# Patient Record
Sex: Male | Born: 1935
Health system: Southern US, Community
[De-identification: ages and names within clinical notes are randomized; demographics above are authoritative.]

## PROBLEM LIST (undated history)

## (undated) DIAGNOSIS — C801 Malignant (primary) neoplasm, unspecified: Secondary | ICD-10-CM

## (undated) DIAGNOSIS — I10 Essential (primary) hypertension: Secondary | ICD-10-CM

---

## 2004-07-10 ENCOUNTER — Ambulatory Visit: Payer: Self-pay | Admitting: Unknown Physician Specialty

## 2011-06-10 ENCOUNTER — Ambulatory Visit: Payer: Self-pay | Admitting: Ophthalmology

## 2011-06-10 LAB — POTASSIUM: Potassium: 4 mmol/L (ref 3.5–5.1)

## 2011-06-22 ENCOUNTER — Ambulatory Visit: Payer: Self-pay | Admitting: Ophthalmology

## 2012-01-20 ENCOUNTER — Ambulatory Visit: Payer: Self-pay | Admitting: Internal Medicine

## 2012-02-01 ENCOUNTER — Ambulatory Visit: Payer: Self-pay | Admitting: Ophthalmology

## 2012-02-08 ENCOUNTER — Ambulatory Visit: Payer: Self-pay | Admitting: Ophthalmology

## 2013-07-10 ENCOUNTER — Emergency Department: Payer: Self-pay | Admitting: Emergency Medicine

## 2013-07-10 DIAGNOSIS — N400 Enlarged prostate without lower urinary tract symptoms: Secondary | ICD-10-CM | POA: Insufficient documentation

## 2013-07-10 LAB — URINALYSIS, COMPLETE
Specific Gravity: 1.02 (ref 1.003–1.030)
Squamous Epithelial: NONE SEEN

## 2013-07-10 LAB — BASIC METABOLIC PANEL
ANION GAP: 6 — AB (ref 7–16)
BUN: 21 mg/dL — ABNORMAL HIGH (ref 7–18)
CREATININE: 1.14 mg/dL (ref 0.60–1.30)
Calcium, Total: 9.6 mg/dL (ref 8.5–10.1)
Chloride: 100 mmol/L (ref 98–107)
Co2: 30 mmol/L (ref 21–32)
EGFR (African American): >60
EGFR (Non-African Amer.): 57 — ABNORMAL LOW
Glucose: 131 mg/dL — ABNORMAL HIGH (ref 65–99)
OSMOLALITY: 277 (ref 275–301)
POTASSIUM: 3.5 mmol/L (ref 3.5–5.1)
SODIUM: 136 mmol/L (ref 136–145)

## 2013-07-10 LAB — CBC
HCT: 45.7 % (ref 40.0–52.0)
HGB: 15.1 g/dL (ref 13.0–18.0)
MCH: 30.9 pg (ref 26.0–34.0)
MCHC: 33 g/dL (ref 32.0–36.0)
MCV: 94 fL (ref 80–100)
PLATELETS: 168 10*3/uL (ref 150–440)
RBC: 4.88 10*6/uL (ref 4.40–5.90)
RDW: 13.5 % (ref 11.5–14.5)
WBC: 12.1 10*3/uL — AB (ref 3.8–10.6)

## 2013-07-11 ENCOUNTER — Inpatient Hospital Stay: Payer: Self-pay | Admitting: Urology

## 2013-07-12 DIAGNOSIS — Z01810 Encounter for preprocedural cardiovascular examination: Secondary | ICD-10-CM

## 2013-07-12 LAB — CBC WITH DIFFERENTIAL/PLATELET
BASOS ABS: 0 10*3/uL (ref 0.0–0.1)
BASOS PCT: 0.2 %
EOS ABS: 0.2 10*3/uL (ref 0.0–0.7)
EOS PCT: 1.6 %
HCT: 37 % — ABNORMAL LOW (ref 40.0–52.0)
HGB: 12.5 g/dL — AB (ref 13.0–18.0)
LYMPHS ABS: 1.1 10*3/uL (ref 1.0–3.6)
Lymphocyte %: 10.6 %
MCH: 31.8 pg (ref 26.0–34.0)
MCHC: 33.7 g/dL (ref 32.0–36.0)
MCV: 94 fL (ref 80–100)
MONOS PCT: 9.9 %
Monocyte #: 1 x10 3/mm (ref 0.2–1.0)
NEUTROS ABS: 7.9 10*3/uL — AB (ref 1.4–6.5)
Neutrophil %: 77.7 %
PLATELETS: 151 10*3/uL (ref 150–440)
RBC: 3.92 10*6/uL — AB (ref 4.40–5.90)
RDW: 13.1 % (ref 11.5–14.5)
WBC: 10.2 10*3/uL (ref 3.8–10.6)

## 2013-07-12 LAB — BASIC METABOLIC PANEL
Anion Gap: 4 — ABNORMAL LOW (ref 7–16)
BUN: 19 mg/dL — ABNORMAL HIGH (ref 7–18)
CALCIUM: 8.5 mg/dL (ref 8.5–10.1)
CO2: 30 mmol/L (ref 21–32)
CREATININE: 1.01 mg/dL (ref 0.60–1.30)
Chloride: 104 mmol/L (ref 98–107)
EGFR (Non-African Amer.): >60
Glucose: 120 mg/dL — ABNORMAL HIGH (ref 65–99)
OSMOLALITY: 279 (ref 275–301)
Potassium: 3.5 mmol/L (ref 3.5–5.1)
Sodium: 138 mmol/L (ref 136–145)

## 2013-07-13 LAB — BASIC METABOLIC PANEL
Anion Gap: 3 — ABNORMAL LOW (ref 7–16)
BUN: 20 mg/dL — AB (ref 7–18)
CHLORIDE: 101 mmol/L (ref 98–107)
CO2: 32 mmol/L (ref 21–32)
CREATININE: 0.92 mg/dL (ref 0.60–1.30)
Calcium, Total: 8.2 mg/dL — ABNORMAL LOW (ref 8.5–10.1)
EGFR (African American): >60
Glucose: 112 mg/dL — ABNORMAL HIGH (ref 65–99)
OSMOLALITY: 275 (ref 275–301)
Potassium: 4.1 mmol/L (ref 3.5–5.1)
SODIUM: 136 mmol/L (ref 136–145)

## 2013-07-13 LAB — HEMOGLOBIN: HGB: 11.3 g/dL — AB (ref 13.0–18.0)

## 2013-08-11 ENCOUNTER — Emergency Department: Payer: Self-pay | Admitting: Emergency Medicine

## 2013-08-11 LAB — URINALYSIS, COMPLETE
Bilirubin,UR: NEGATIVE
Glucose,UR: NEGATIVE mg/dL (ref 0–75)
Hyaline Cast: 2
Ketone: NEGATIVE
Nitrite: NEGATIVE
Ph: 7 (ref 4.5–8.0)
Protein: 30
RBC,UR: 245 /HPF (ref 0–5)
SQUAMOUS EPITHELIAL: NONE SEEN
Specific Gravity: 1.015 (ref 1.003–1.030)

## 2013-08-12 ENCOUNTER — Emergency Department: Payer: Self-pay | Admitting: Emergency Medicine

## 2013-08-14 LAB — URINE CULTURE

## 2014-07-02 NOTE — Op Note (Signed)
PATIENT NAME:  Robert [name], Robert [name] MR#:  [id number] DATE OF BIRTH:  [birth year]  DATE OF PROCEDURE:  02/08/2012  PREOPERATIVE DIAGNOSIS: Visually significant cataract of the right eye.   POSTOPERATIVE DIAGNOSIS: Visually significant cataract of the right eye.   OPERATIVE PROCEDURE: Cataract extraction by phacoemulsification with implant of intraocular lens to right eye.   SURGEON: Birder Robson, MD.   ANESTHESIA:  1. Managed anesthesia care.  2. Topical tetracaine drops followed by 2% Xylocaine jelly applied in the preoperative holding area.   COMPLICATIONS: None.   TECHNIQUE:  Stop and chop.  DESCRIPTION OF PROCEDURE: The patient was examined and consented in the preoperative holding area where the aforementioned topical anesthesia was applied to the right eye and then brought back to the Operating Room where the right eye was prepped and draped in the usual sterile ophthalmic fashion and a lid speculum was placed. A paracentesis was created with the side port blade and the anterior chamber was filled with viscoelastic. A near clear corneal incision was performed with the steel keratome. A continuous curvilinear capsulorrhexis was performed with a cystotome followed by the capsulorrhexis forceps. Hydrodissection and hydrodelineation were carried out with BSS on a blunt cannula. The lens was removed in a stop and chop technique and the remaining cortical material was removed with the irrigation-aspiration handpiece. The capsular bag was inflated with viscoelastic and the Tecnis ZCB00 14.5-diopter lens, serial number [id number] was placed in the capsular bag without complication. The remaining viscoelastic was removed from the eye with the irrigation-aspiration handpiece. The wounds were hydrated. The anterior chamber was flushed with Miostat and the eye was inflated to physiologic pressure. The wounds were found to be water tight. The eye was dressed with Vigamox. The patient was given protective  glasses to wear throughout the day and a shield with which to sleep tonight. The patient was also given drops with which to begin a drop regimen today and will follow-up with me in one day.   ____________________________ Livingston Diones. Hurbert Duran, MD wlp:slb D: 02/08/2012 16:34:18 ET T: 02/08/2012 16:52:20 ET JOB#: [id number]  cc: Dierre Crevier L. Tapanga Ottaway, MD, <Dictator> Livingston Diones Chessie Neuharth MD ELECTRONICALLY SIGNED 02/17/2012 15:44

## 2014-07-06 NOTE — Discharge Summary (Signed)
PATIENT NAME:  Robert [name], Robert [name] MR#:  [id number] DATE OF BIRTH:  Feb 09, 1923  DATE OF ADMISSION:  07/11/2013 DATE OF DISCHARGE:  07/13/2013   PREOPERATIVE DIAGNOSIS: Massive clot retention with benign prostatic hypertrophy and prostatic hemorrhage.   FINAL DIAGNOSIS: Massive clot retention with benign prostatic hypertrophy and prostatic hemorrhage.   PROCEDURE: On 07/12/2013, I did a clot evacuation and fulguration of a hemorrhagic prostate, especially in the middle lobe.   HOSPITAL COURSE: The patient's hospital course was basically benign. He was put in on Wednesday night, and on Thursday we did the procedure at 10:00 in the morning. The patient through the night had clot evacuation utilizing 3-way Foley, which was a 24-French 3-way with normal saline irrigation. At the time of surgery, I also removed quite a bit of clot in the bladder. Once the clot was removed, no tumors were seen. Quite a bit of saccule formation was seen, indicating long-term outlet obstruction. He had a large prostate and right now he has only been lateral lobe hypertrophy, as I necrosed the bleeding middle lobe that was in the way of the outlet. He is discharged on his normal home medications. During his hospitalization, he was given ceftriaxone. He will be discharged on Cipro 500 mg daily which he has at home, pain med in the form of Percocet. He has clear urine, so he is going to be discharged with a Foley catheter in place.   PHYSICAL EXAMINATION FOR DISCHARGE: Abdomen soft. Bowel sounds present. Alert and oriented x 3. No chest pain. No shortness of breath. No evidence of pain in his calf. He does have some foot pain, which is unknown. I examined it. There is no fracture, ecchymosis, or any evidence of anything more than a tendinitis in his lateral portion of his right foot. He moves the foot well and can stand and walk on it, so I do not think there is a fracture there at all, and there is no really reason for any problem.    His hemoglobin went from 15 to 11.3 as a result of this bleeding. He was not transfused.   I am going to see this patient in followup on Monday morning. He will be given a leg bag for his Foley and will be taken care of in the office. Future plans are for a microwave of his long lateral lobes, which are probably causing a degree of outlet obstruction.   ____________________________ Janice Coffin. Elnoria Howard, DO rdh:jcm D: 07/13/2013 13:34:06 ET T: 07/13/2013 14:19:29 ET JOB#: [id number]  cc: Janice Coffin. Elnoria Howard, DO, <Dictator> Schyler Counsell D Jakarius Flamenco DO ELECTRONICALLY SIGNED 07/27/2013 14:06

## 2014-07-06 NOTE — H&P (Signed)
PATIENT NAME:  Robert [name], Robert [name] MR#:  [id number] DATE OF BIRTH:  05/25/1922  DATE OF ADMISSION:  07/11/2013  Patient of Dr. Emily Filbert.  The patient is admitted after being seen in my office for gross hematuria and acute urinary retention. Irrigated his bladder with a 20-French coude catheter and emptied out quite a bit of dark urine and clots. Review of CT scan and reviewed clot in the bladder and a large bladder tumor. Prostatic enlargement was not significant. The patient has had 2 bleeds, one in January and one recently. This tumor, however, has been present for a long period of time. Smoking history is when he was young, so risk factors for smoking history at a young age. Coincidentally, a calculus was seen in the bladder and this is probably a calculus that he passed recently down the right ureter, as there is some hydronephrosis on the right side.  Otherwise, his medical history is benign.  He has hypertension, for which he takes larsatan Otherwise, he has no other major medications at home, other than vitamins.  PAST SURGICAL HISTORY:  Relatively benign. He has had bilateral hernias. He has never had heart surgery, stroke. Never been anticoagulated. He does use CPAP. This he uses at home. He will have an order written to use it here.  REVIEW OF SYSTEMS:  Reveals no history of headache, double vision, hearing loss or changes in sight, glaucoma or cataracts surgery.  HEENT: No sinus problems. No swallowing problems. Teeth are intact. UPPER RESPIRATORY:  No history of asthma, pneumonia, emphysema, shortness of breath but he does use a CPAP at night.  No dyspnea on exertion, is active. 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 No murmur has ever been heard on this patient.  ABDOMEN: No history of gallbladder disease, dyspepsia, hematemesis, melena or severe  constipation or diarrhea. He has had no gallbladder disease, although he has gallstones present on CAT scan.  GENITOURINARY: No history of urinary retention until this most recent episode. Voided well.  Has had only 2 episodes of gross painless hematuria. He has never had a CAT scan. This is his first CAT scan. He has had a bilateral herniorrhaphy. He no longer is active sexually, but has 8 children and has been married 20 years and has 17 great-grandchildren.  MUSCULOSKELETAL: He has no problem walking. He walks regularly. He is active. He has no muscle weakness. No complaint of severe back pain, other than normal osteoarthritis of aging.   PHYSICAL EXAMINATION: GENERAL:  Alert, oriented white male in no acute distress now that I have irrigated his bladder.  NECK: At the present supple. No thyroid enlargement. No supraclavicular, axillary, inguinal lymphadenopathy.  LUNGS: Clear to auscultation.  HEART: Sounds regular rate and rhythm. No gross murmurs, clicks or rubs. ABDOMEN: Bowel sounds are present. Abdomen is no longer distended. Bladder is soft and nondistended. Foley catheter is draining dark red urine. GENITOURINARY:  The penis is circumcised. Testes are descended and normal.  RECTAL:  Reveals a slightly enlarged prostate. No nodules, masses or growths. PSA has not been obtained because he has been instrumented.  EXTREMITIES: Show no peripheral edema. There are varicosities. Peripheral pulses are bilaterally equal and normal.  IMPRESSION:  I discussed at length with the patient the procedure to be done tomorrow. We are going to attempt to resect as much of the tumor  out as possible, although if I am not able to resect it all out in an hour to two, then we will have to resect it as deep as I can and control bleeding so that he can be treated with appropriate treatment for an 81 year old male with massive bladder tumor.  Follow-up is in the hospital with a Foley catheter over the next 24 to 48  hours. Hopefully, he will discharge by Friday.    ____________________________ Janice Coffin. Elnoria Howard, [place] rdh:ce D: 07/11/2013 18:35:01 ET T: 07/11/2013 18:50:30 ET JOB#: [id number]  cc: Janice Coffin. Elnoria Howard, DO, <Dictator> Jamair Cato D Alianah Lofton DO ELECTRONICALLY SIGNED 07/13/2013 8:19

## 2014-07-06 NOTE — Op Note (Signed)
PATIENT NAME:  [name], Robert [name] MR#:  [id number] DATE OF BIRTH:  1922-04-09  DATE OF PROCEDURE:  07/12/2013  PREOPERATIVE DIAGNOSIS: Bladder mass.  POSTOPERATIVE DIAGNOSIS: Massive hematoma of the bladder with hemorrhagic prostate.  PROCEDURE: Cystoscopy, evacuation of hematoma with coagulation of bleeding.   SURGEON: Yazleemar Strassner D. Elnoria Howard, DO  ANESTHESIA: General.   COMPLICATIONS: None.   DESCRIPTION OF PROCEDURE: With the patient sterilely prepped and draped in the supine lithotomy position for ease of approach to the external genitalia and the urethra, I do a cystoscopy. The patient has a cystoscopy which reveals a normal urethral caliber with no stricture disease. The prostatic urethra is about 3.5 cm in length with a slightly enlarged bleeding middle lobe that extends into the bladder at the bladder neck. The bladder itself shows extensive trabeculation, but no evidences of tumor, mass or growth. He does look like he has long-term outlet obstruction from his prostate. However, I had no permission to take out his prostate, but I did coagulate any bleeding, especially in the middle lobe. I used irrigation, Iglesias type double-lumen 26 French resectoscope with normal saline for this. I used the ball electrode and necrosed any bleeding in the middle lobe. So now, he just has lateral lobe left in the prostate, and that is not bleeding. Any hematoma that was present was evacuated out. Much of it had been evacuated through the night. There was no evidence of acute cystitis in the bladder itself. So, at the end of the procedure, the bladder is free of clot, there is no more bleeding, and so I am able to put a 24 [place] 3-way Foley in, 10 mL of fluid is placed in the balloon. There is clear irrigation. Once the balloon is in good position in the bladder at the bladder neck, I irrigate, and the irrigation is clear, without clots. Then, 30 mL of 0.5% plain Marcaine is placed in the bladder. Catheter is then  connected to constant bladder irrigation with normal saline. Rectal exam reveals a slightly enlarged prostate. No fixation of the pelvis. No rectal masses. I put a B and O suppository in the rectum, and he is sent to recovery in satisfactory condition.    ____________________________ Janice Coffin. Elnoria Howard, DO rdh:lb D: 07/12/2013 10:58:38 ET T: 07/12/2013 11:08:37 ET JOB#: [id number]  cc: Janice Coffin. Elnoria Howard, DO, <Dictator> Dailey Buccheri D Raeshaun Simson DO ELECTRONICALLY SIGNED 07/13/2013 8:19

## 2014-07-07 NOTE — Op Note (Signed)
PATIENT NAME:  [name], Robert MR#:  [id number] DATE OF BIRTH:  1922/10/03  DATE OF PROCEDURE:  06/22/2011  PREOPERATIVE DIAGNOSIS: Visually significant cataract of the left eye.   POSTOPERATIVE DIAGNOSIS: Visually significant cataract of the left eye.   OPERATIVE PROCEDURE: Cataract extraction by phacoemulsification with implant of intraocular lens to left eye.   SURGEON: Birder Robson, MD.   ANESTHESIA:  1. Managed anesthesia care.  2. Topical tetracaine drops followed by 2% Xylocaine jelly applied in the preoperative holding area.   COMPLICATIONS: None.   TECHNIQUE:  Stop-and-chop    DESCRIPTION OF PROCEDURE: The patient was examined and consented in the preoperative holding area where the aforementioned topical anesthesia was applied to the left eye and then brought back to the Operating Room where the left eye was prepped and draped in the usual sterile ophthalmic fashion and a lid speculum was placed. A paracentesis was created with the side port blade and the anterior chamber was filled with viscoelastic. A near clear corneal incision was performed with the steel keratome. A continuous curvilinear capsulorrhexis was performed with a cystotome followed by the capsulorrhexis forceps. Hydrodissection and hydrodelineation were carried out with BSS on a blunt cannula. The lens was removed in a stop-and-chop technique and the remaining cortical material was removed with the irrigation-aspiration handpiece. The capsular bag was inflated with viscoelastic and the Technus ZCB00 15.5-diopter lens, serial number [id number] was placed in the capsular bag without complication. The remaining viscoelastic was removed from the eye with the irrigation-aspiration handpiece. The wounds were hydrated. The anterior chamber was flushed with Miostat and the eye was inflated to physiologic pressure. The wounds were found to be water tight. The eye was dressed with Vigamox. The patient was given protective  glasses to wear throughout the day and a shield with which to sleep tonight. The patient was also given drops with which to begin a drop regimen today and will follow-up with me in one day.   ____________________________ Livingston Diones. Mallika Sanmiguel, MD wlp:drc D: 06/22/2011 12:54:06 ET T: 06/22/2011 13:16:50 ET JOB#: [id number]  cc: Alivia Cimino L. Tyra Gural, MD, <Dictator> Livingston Diones Willeen Novak MD ELECTRONICALLY SIGNED 06/23/2011 11:41

## 2015-04-04 DIAGNOSIS — Z8739 Personal history of other diseases of the musculoskeletal system and connective tissue: Secondary | ICD-10-CM | POA: Insufficient documentation

## 2015-12-23 DIAGNOSIS — I10 Essential (primary) hypertension: Secondary | ICD-10-CM | POA: Insufficient documentation

## 2016-06-23 DIAGNOSIS — Z0000 Encounter for general adult medical examination without abnormal findings: Secondary | ICD-10-CM | POA: Insufficient documentation

## 2017-07-01 ENCOUNTER — Encounter: Payer: Self-pay | Admitting: Emergency Medicine

## 2017-07-01 ENCOUNTER — Other Ambulatory Visit: Payer: Self-pay

## 2017-07-01 ENCOUNTER — Emergency Department
Admission: EM | Admit: 2017-07-01 | Discharge: 2017-07-01 | Disposition: A | Discharge status: Home / Self Care | Payer: Medicare HMO | Attending: Emergency Medicine | Admitting: Emergency Medicine

## 2017-07-01 DIAGNOSIS — L7622 Postprocedural hemorrhage and hematoma of skin and subcutaneous tissue following other procedure: Secondary | ICD-10-CM | POA: Insufficient documentation

## 2017-07-01 DIAGNOSIS — F17210 Nicotine dependence, cigarettes, uncomplicated: Secondary | ICD-10-CM | POA: Diagnosis not present

## 2017-07-01 DIAGNOSIS — M96830 Postprocedural hemorrhage and hematoma of a musculoskeletal structure following a musculoskeletal system procedure: Secondary | ICD-10-CM

## 2017-07-01 HISTORY — DX: Essential (primary) hypertension: I10

## 2017-07-01 HISTORY — DX: Malignant (primary) neoplasm, unspecified: C80.1

## 2017-07-01 MED ORDER — TRANEXAMIC ACID 1000 MG/10ML IV SOLN
500.0000 mg | Freq: Once | INTRAVENOUS | Status: AC
  Administered 2017-07-01: 500 mg via TOPICAL
  Filled 2017-07-01: qty 10

## 2017-07-01 NOTE — ED Notes (Signed)
Pt alert and oriented X4, active, cooperative, pt in NAD. RR even and unlabored, color WNL.  Pt informed to return if any life threatening symptoms occur.  Discharge and followup instructions reviewed.  

## 2017-07-01 NOTE — ED Notes (Signed)
ED Provider at bedside. 

## 2017-07-01 NOTE — ED Notes (Signed)
Family at bedside. 

## 2017-07-01 NOTE — ED Provider Notes (Signed)
[facility] Emergency Department Provider Note  ____________________________________________   First MD Initiated Contact with Patient 07/01/17 1537     (approximate)  I have reviewed the triage vital signs and the nursing notes.   HISTORY  Chief Complaint No chief complaint on file.   HPI Robert [name] is a 84 y.o. male who self presents to the emergency department with constant oozing bleeding from a wound to his left hand.  Had a biopsy performed this last Tuesday 3 days ago and the wound has been "oozing" ever since.  The wound is currently wrapped.  He denies taking aspirin Plavix or any blood thinning medications.  His symptoms have been gradual onset and constant.  They are currently mild to moderate in severity.  Past Medical History:  Diagnosis Date  . Cancer ([place])    skin cancer  . Hypertension     There are no active problems to display for this patient.   History reviewed. No pertinent surgical history.  Prior to Admission medications   Not on File    Allergies Patient has no known allergies.  No family history on file.  Social History Social History   Tobacco Use  . Smoking status: Current Every Day Smoker    Types: Cigarettes  . Smokeless tobacco: Never Used  Substance Use Topics  . Alcohol use: Not on file  . Drug use: Not on file    Review of Systems Constitutional: No fever/chills Cardiovascular: Denies chest pain. Respiratory: Denies shortness of breath. Gastrointestinal: No abdominal pain.  No nausea, no vomiting.  No diarrhea.  No constipation. Musculoskeletal: Negative for back pain. Neurological: Negative for headaches   ____________________________________________   PHYSICAL EXAM:  VITAL SIGNS: ED Triage Vitals  Enc Vitals Group     BP 07/01/17 1522 (!) 152/77     Pulse Rate 07/01/17 1522 99     Resp 07/01/17 1522 16     Temp 07/01/17 1522 98.2 F (36.8 C)     Temp Source 07/01/17 1522 Oral      SpO2 --      Weight 07/01/17 1520 155 lb (70.3 kg)     Height 07/01/17 1520 5\' 7"  (1.702 m)     Head Circumference --      Peak Flow --      Pain Score 07/01/17 1520 0     Pain Loc --      Pain Edu? --      Excl. in [place]? --     Constitutional: Alert and oriented x4 joking laughing pleasant cooperative speaks full sentences Head: Atraumatic. Nose: No congestion/rhinnorhea. Mouth/Throat: No trismus Neck: No stridor.   Cardiovascular: Regular rate and rhythm Respiratory: Normal respiratory effort.  No retractions. Neurologic:  Normal speech and language. No gross focal neurologic deficits are appreciated.  Skin: Biopsy site to the proximal dorsal aspect of his left hand roughly 2 cm around losing constant stream of dark blood    ____________________________________________  LABS (all labs ordered are listed, but only abnormal results are displayed)  Labs Reviewed - No data to display   __________________________________________  EKG   ____________________________________________  RADIOLOGY   ____________________________________________   DIFFERENTIAL includes but not limited to  Wound infection, fracture, bleeding   PROCEDURES  Procedure(s) performed: no  Procedures  Critical Care performed: no  Observation: no ____________________________________________   INITIAL IMPRESSION / ASSESSMENT AND PLAN / ED COURSE  Pertinent labs & imaging results that were available during my care of the patient  were reviewed by me and considered in my medical decision making (see chart for details).  The patient has a slow ooze of what appears to be capillary blood.  I initially anesthetized the area with 1% lidocaine with epinephrine which improved the bleeding and then placed a 2 x 2 with tranexamic acid soaking it directly over the wound for 30 minutes.  Following this I achieved complete hemostasis.  I then placed Surgicel and redressed the hand.  He is no longer  bleeding.  Discharged home in improved condition verbalizes understanding and agreement with the plan.      ____________________________________________   FINAL CLINICAL IMPRESSION(S) / ED DIAGNOSES  Final diagnoses:  Postoperative hemorrhage of musculoskeletal structure following musculoskeletal procedure      NEW MEDICATIONS STARTED DURING THIS VISIT:  There are no discharge medications for this patient.    Note:  This document was prepared using Dragon voice recognition software and may include unintentional dictation errors.      Darel Hong, MD 07/02/17 2232

## 2017-07-01 NOTE — Discharge Instructions (Signed)
Please do not get your left hand wet until tomorrow morning.  Return to the ED for any concerns.  It was a pleasure to take care of you today, and thank you for coming to our emergency department.  If you have any questions or concerns before leaving please ask the nurse to grab me and I'm more than happy to go through your aftercare instructions again.  If you were prescribed any opioid pain medication today such as Norco, Vicodin, Percocet, morphine, hydrocodone, or oxycodone please make sure you do not drive when you are taking this medication as it can alter your ability to drive safely.  If you have any concerns once you are home that you are not improving or are in fact getting worse before you can make it to your follow-up appointment, please do not hesitate to call 911 and come back for further evaluation.  Darel Hong, MD

## 2017-07-01 NOTE — ED Triage Notes (Signed)
Had a skin cancer removed from right forearm on Tuesday.  Initially had post operative bleeding in the office, but improved.  Bleeding started again yesterday, but resolved, then started bleeding again today.

## 2017-07-01 NOTE — ED Notes (Signed)
Left fore arm wound dressed with DSD and coban dressing.  Wound to right wrist, area with blood clotting noted and small amount of bleeding.

## 2017-07-15 ENCOUNTER — Other Ambulatory Visit: Payer: Self-pay | Admitting: Otolaryngology

## 2017-07-15 DIAGNOSIS — H903 Sensorineural hearing loss, bilateral: Secondary | ICD-10-CM

## 2017-07-27 ENCOUNTER — Ambulatory Visit
Admission: RE | Admit: 2017-07-27 | Discharge: 2017-07-27 | Disposition: A | Discharge status: Home / Self Care | Payer: Medicare HMO | Source: Ambulatory Visit | Attending: Otolaryngology | Admitting: Otolaryngology

## 2017-07-27 DIAGNOSIS — H903 Sensorineural hearing loss, bilateral: Secondary | ICD-10-CM | POA: Diagnosis not present

## 2017-07-27 MED ORDER — GADOBENATE DIMEGLUMINE 529 MG/ML IV SOLN
14.0000 mL | Freq: Once | INTRAVENOUS | Status: AC | PRN
  Administered 2017-07-27: 14 mL via INTRAVENOUS

## 2017-11-21 ENCOUNTER — Encounter: Payer: Self-pay | Admitting: Podiatry

## 2017-11-21 ENCOUNTER — Ambulatory Visit (INDEPENDENT_AMBULATORY_CARE_PROVIDER_SITE_OTHER): Payer: Medicare HMO | Admitting: Podiatry

## 2017-11-21 VITALS — BP 140/82 | HR 90

## 2017-11-21 DIAGNOSIS — M79675 Pain in left toe(s): Secondary | ICD-10-CM | POA: Diagnosis not present

## 2017-11-21 DIAGNOSIS — B351 Tinea unguium: Secondary | ICD-10-CM

## 2017-11-21 DIAGNOSIS — I10 Essential (primary) hypertension: Secondary | ICD-10-CM

## 2017-11-21 DIAGNOSIS — M79674 Pain in right toe(s): Secondary | ICD-10-CM | POA: Diagnosis not present

## 2017-11-21 NOTE — Progress Notes (Addendum)
Complaint:  Visit Type: Patient returns to my office for continued preventative foot care services. Complaint: Patient states" my nails have grown long and thick and become painful to walk and wear shoes"  The patient presents for preventative foot care services. No changes to ROS  Podiatric Exam: Vascular: dorsalis pedis and posterior tibial pulses are palpable bilateral. Capillary return is immediate. Temperature gradient is WNL. Skin turgor WNL  Sensorium: Normal Semmes Weinstein monofilament test. Normal tactile sensation bilaterally. Nail Exam: Pt has thick disfigured discolored nails with subungual debris noted bilateral entire nail hallux through fifth toenails Ulcer Exam: There is no evidence of ulcer or pre-ulcerative changes or infection. Orthopedic Exam: Muscle tone and strength are WNL. No limitations in general ROM. No crepitus or effusions noted. Foot type and digits show no abnormalities. HAV with overlapping second toe  B/L. Skin: No Porokeratosis. No infection or ulcers  Diagnosis:  Onychomycosis, , Pain in right toe, pain in left toes  Treatment & Plan Procedures and Treatment: Consent by patient was obtained for treatment procedures.   Debridement of mycotic and hypertrophic toenails, 1 through 5 bilateral and clearing of subungual debris. No ulceration, no infection noted.  Return Visit-Office Procedure: Patient instructed to return to the office for a follow up visit prn  for continued evaluation and treatment.    Gardiner Barefoot DPM

## 2017-12-28 DIAGNOSIS — G4733 Obstructive sleep apnea (adult) (pediatric): Secondary | ICD-10-CM

## 2019-08-29 DIAGNOSIS — D696 Thrombocytopenia, unspecified: Secondary | ICD-10-CM | POA: Diagnosis present

## 2019-10-04 ENCOUNTER — Ambulatory Visit
Admission: RE | Admit: 2019-10-04 | Discharge: 2019-10-04 | Disposition: A | Discharge status: Home / Self Care | Payer: Medicare HMO | Source: Ambulatory Visit | Attending: Physician Assistant | Admitting: Physician Assistant

## 2019-10-04 ENCOUNTER — Other Ambulatory Visit: Payer: Self-pay

## 2019-10-04 ENCOUNTER — Other Ambulatory Visit: Payer: Self-pay | Admitting: Physician Assistant

## 2019-10-04 DIAGNOSIS — Y92009 Unspecified place in unspecified non-institutional (private) residence as the place of occurrence of the external cause: Secondary | ICD-10-CM | POA: Insufficient documentation

## 2019-10-04 DIAGNOSIS — S0990XA Unspecified injury of head, initial encounter: Secondary | ICD-10-CM | POA: Diagnosis present

## 2019-10-04 DIAGNOSIS — H1132 Conjunctival hemorrhage, left eye: Secondary | ICD-10-CM

## 2019-10-04 DIAGNOSIS — W19XXXA Unspecified fall, initial encounter: Secondary | ICD-10-CM | POA: Diagnosis not present

## 2019-10-04 DIAGNOSIS — I159 Secondary hypertension, unspecified: Secondary | ICD-10-CM | POA: Diagnosis not present

## 2019-10-04 DIAGNOSIS — Z043 Encounter for examination and observation following other accident: Secondary | ICD-10-CM | POA: Diagnosis not present

## 2019-10-04 DIAGNOSIS — I6782 Cerebral ischemia: Secondary | ICD-10-CM | POA: Diagnosis not present

## 2019-11-26 ENCOUNTER — Ambulatory Visit: Payer: Medicare HMO | Attending: Internal Medicine

## 2019-11-26 ENCOUNTER — Ambulatory Visit: Payer: Self-pay

## 2019-11-26 DIAGNOSIS — Z23 Encounter for immunization: Secondary | ICD-10-CM

## 2019-11-26 NOTE — Progress Notes (Signed)
° °  Covid-19 Vaccination Clinic  Name:  ZADOK HOLAWAY    MRN: [id number] DOB: Oct 27, 1922  11/26/2019  Mr. Sanzone was observed post Covid-19 immunization for 15 minutes without incident. He was provided with Vaccine Information Sheet and instruction to access the V-Safe system.   Mr. Huezo was instructed to call 911 with any severe reactions post vaccine:  Difficulty breathing   Swelling of face and throat   A fast heartbeat   A bad rash all over body   Dizziness and weakness

## 2021-03-15 DIAGNOSIS — N400 Enlarged prostate without lower urinary tract symptoms: Secondary | ICD-10-CM | POA: Insufficient documentation

## 2021-03-15 DIAGNOSIS — F02A0 Dementia in other diseases classified elsewhere, mild, without behavioral disturbance, psychotic disturbance, mood disturbance, and anxiety: Secondary | ICD-10-CM | POA: Diagnosis present

## 2021-09-16 IMAGING — CT CT HEAD W/O CM
4 series · 13 of 47 positions shown, 15 images · non-contrast
Comparison: MRI brain dated July 27, 2017.

CLINICAL DATA: Fall.

EXAM:
CT HEAD WITHOUT CONTRAST
TECHNIQUE: Contiguous axial images were obtained from the base of the skull
through the vertex without intravenous contrast.

[Series 2: axial st head 5.00 ax · axial · 0.33mm/px · z∈[-536,-457]mm · 5 of 26 slices shown, 7 images]
[im 5/26  brain]
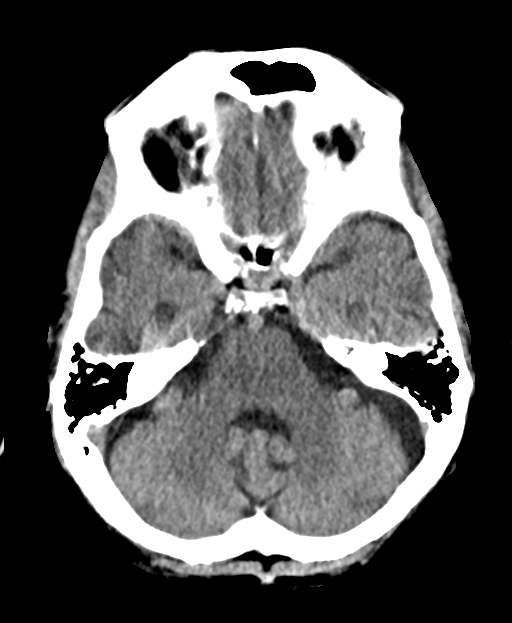
[im 5/26  bone]
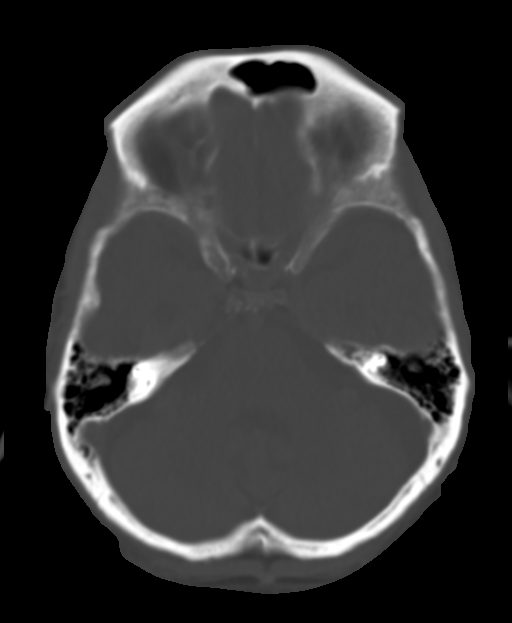
[im 9/26  brain]
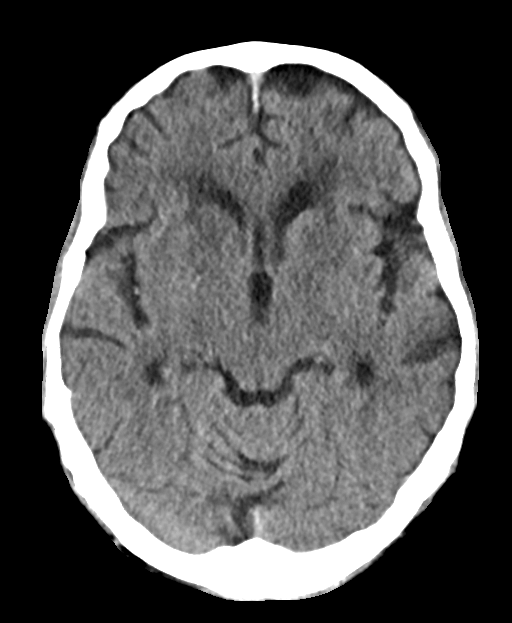
[im 13/26  brain]
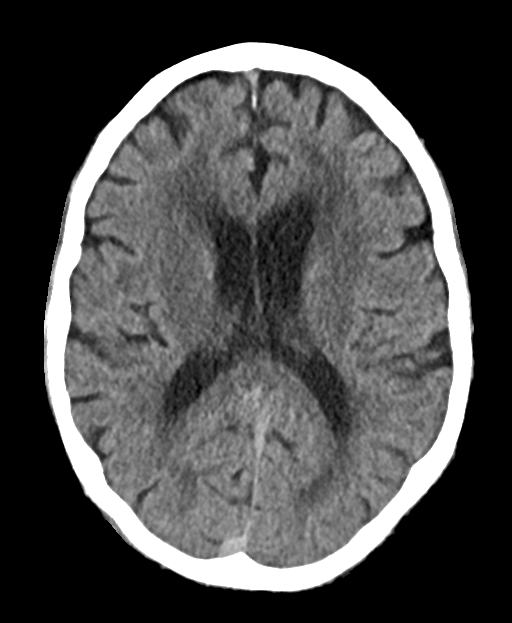
[im 17/26  brain]
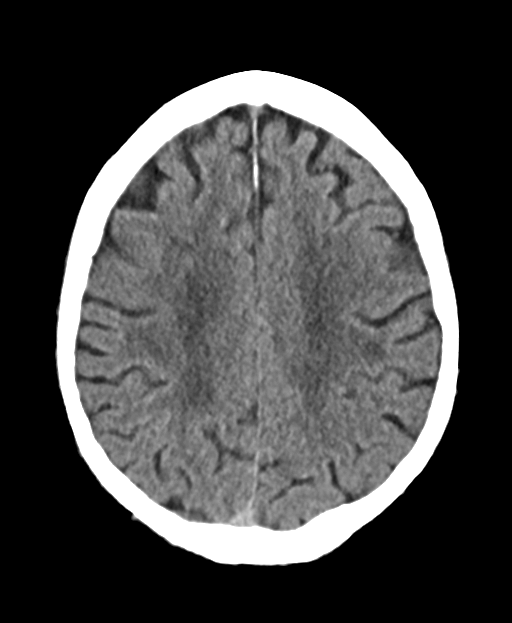
[im 21/26  brain]
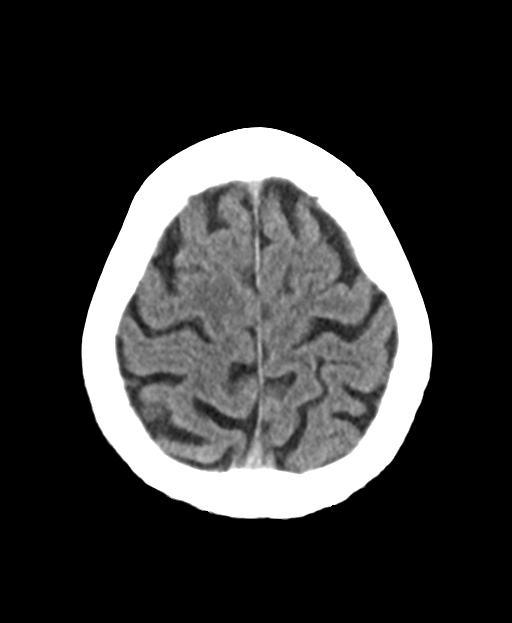
[im 21/26  bone]
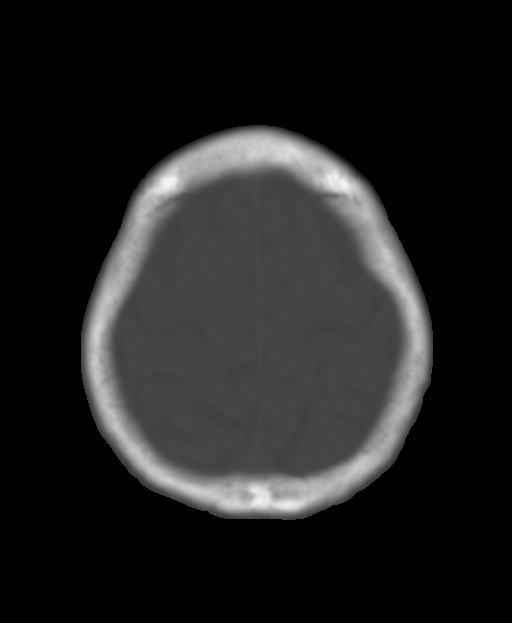

[Series 4: axial bone head 1.50 ax · axial · 0.33mm/px · z∈[-545,-534]mm · 2 of 89 slices shown]
[im 9/89  bone]
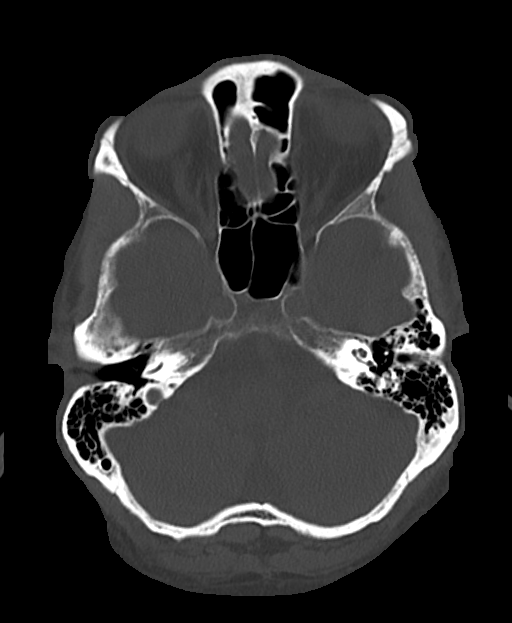
[im 17/89  bone]
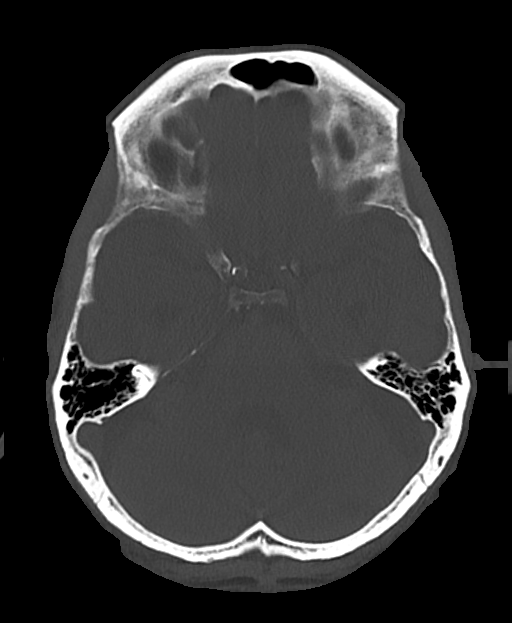

[Series 6: coronals head 3.00 cor · coronal · 0.26mm/px · 3 of 69 slices shown]
[im 23/69  brain]
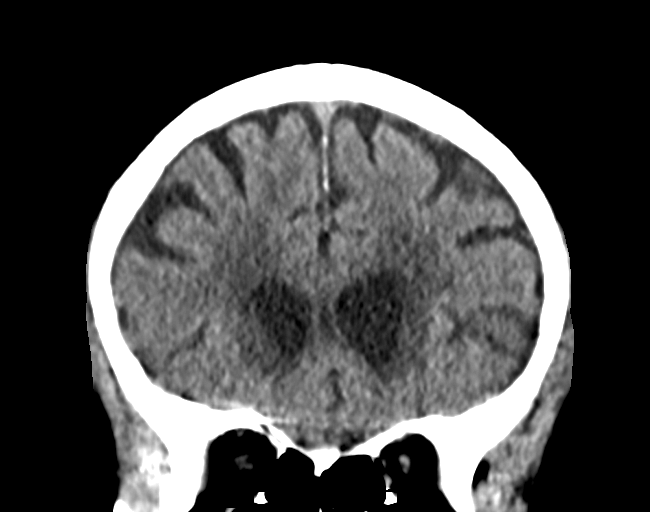
[im 31/69  brain]
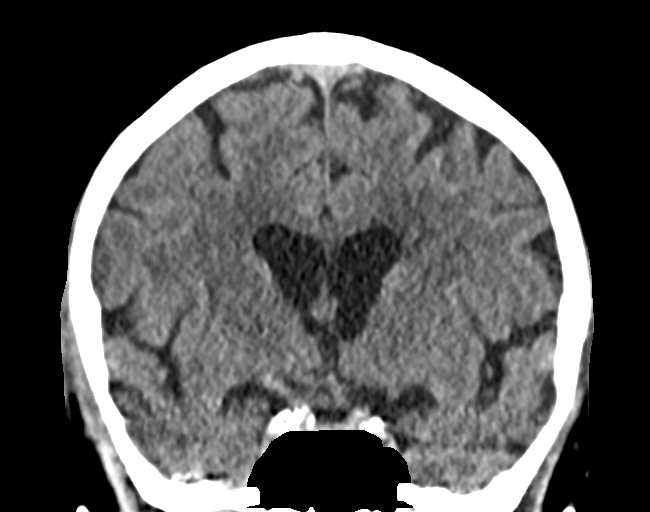
[im 38/69  brain]
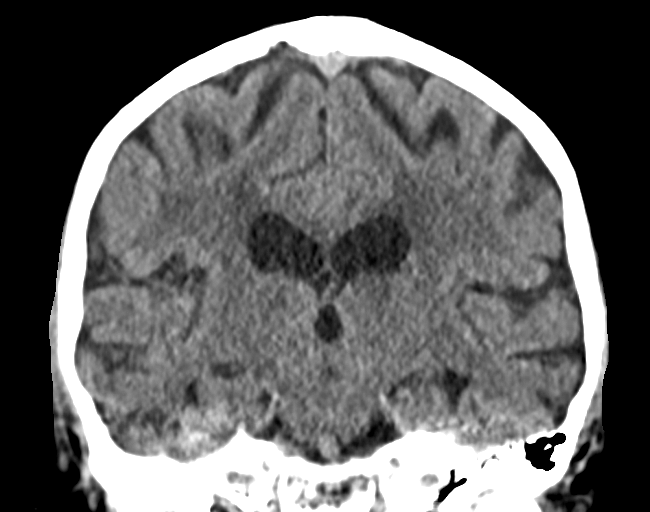

[Series 8: sagittals head 3.00 sag · sagittal · 0.26mm/px · 3 of 57 slices shown]
[im 19/57  brain]
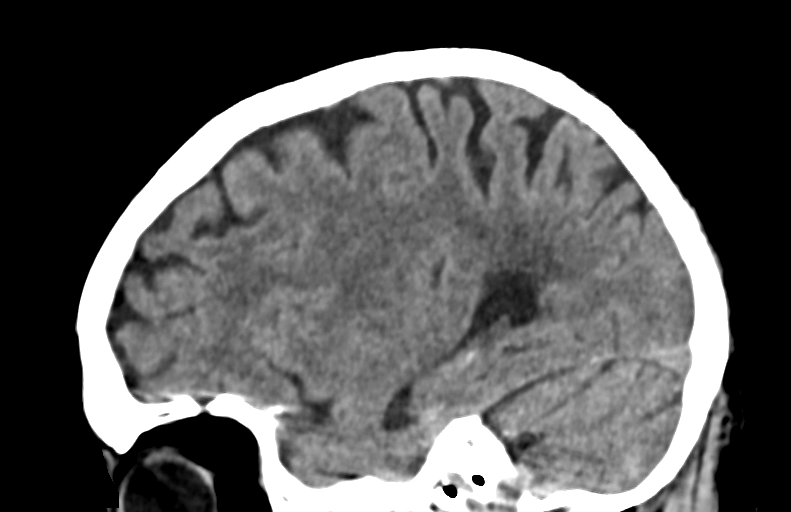
[im 29/57  brain]
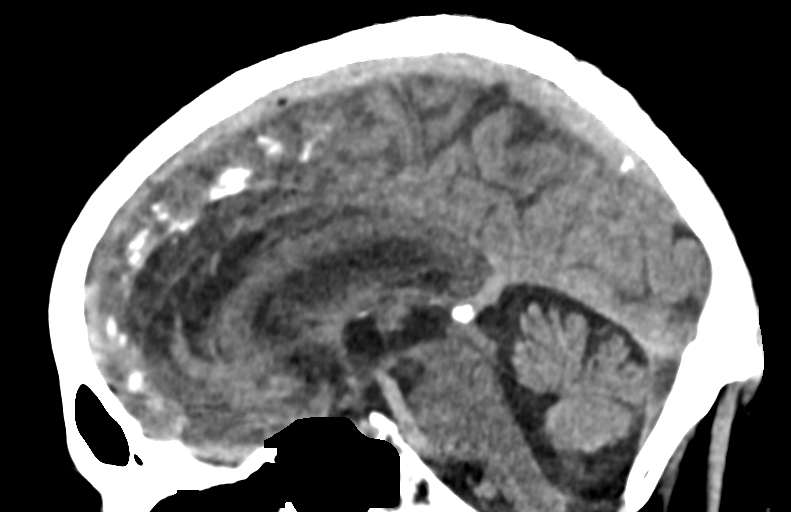
[im 38/57  brain]
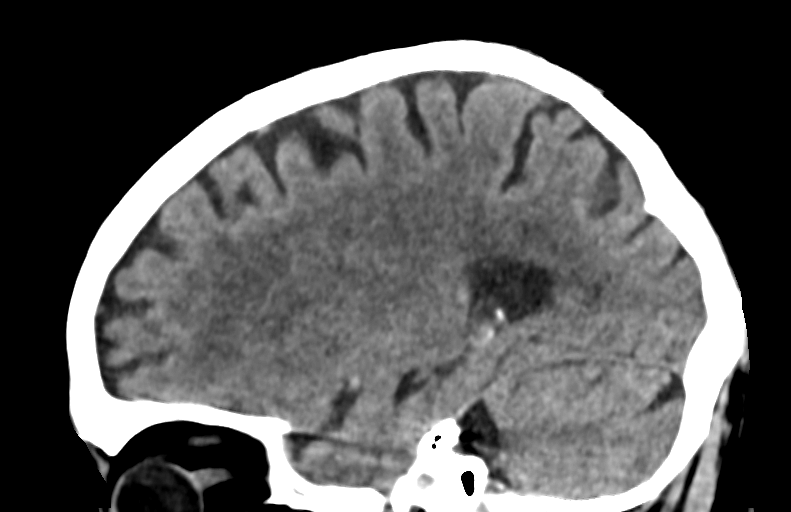

[13 of 47 positions shown; findings below may reference images not displayed]

FINDINGS: Brain: No evidence of acute infarction, hemorrhage, hydrocephalus,
extra-axial collection or mass lesion/mass effect. Stable mild
atrophy and moderate chronic microvascular ischemic changes.

Vascular: Atherosclerotic vascular calcification of the carotid
siphons. No hyperdense vessel.

Skull: Normal. Negative for fracture or focal lesion.

Sinuses/Orbits: No acute finding.

Other: None.
IMPRESSION: 1. No acute intracranial abnormality.
2. Stable mild atrophy and moderate chronic microvascular ischemic
changes.

## 2022-02-25 ENCOUNTER — Ambulatory Visit: Payer: Medicare HMO | Admitting: Podiatry

## 2022-03-12 ENCOUNTER — Other Ambulatory Visit: Payer: Self-pay

## 2022-03-12 ENCOUNTER — Other Ambulatory Visit: Payer: Self-pay | Admitting: Family Medicine

## 2022-03-12 ENCOUNTER — Ambulatory Visit
Admission: RE | Admit: 2022-03-12 | Discharge: 2022-03-12 | Disposition: A | Discharge status: Home / Self Care | Payer: Medicare HMO | Source: Ambulatory Visit | Attending: Family Medicine | Admitting: Family Medicine

## 2022-03-12 DIAGNOSIS — M79605 Pain in left leg: Secondary | ICD-10-CM | POA: Diagnosis not present

## 2022-04-30 ENCOUNTER — Emergency Department: Payer: Medicare HMO

## 2022-04-30 ENCOUNTER — Inpatient Hospital Stay
Admission: EM | Admit: 2022-04-30 | Discharge: 2022-05-04 | DRG: 689 | Disposition: A | Discharge status: Home Health | Payer: Medicare HMO | Attending: Internal Medicine | Admitting: Internal Medicine

## 2022-04-30 DIAGNOSIS — R531 Weakness: Secondary | ICD-10-CM

## 2022-04-30 DIAGNOSIS — D696 Thrombocytopenia, unspecified: Secondary | ICD-10-CM | POA: Diagnosis not present

## 2022-04-30 DIAGNOSIS — Z85828 Personal history of other malignant neoplasm of skin: Secondary | ICD-10-CM

## 2022-04-30 DIAGNOSIS — R32 Unspecified urinary incontinence: Secondary | ICD-10-CM | POA: Diagnosis present

## 2022-04-30 DIAGNOSIS — N390 Urinary tract infection, site not specified: Secondary | ICD-10-CM | POA: Diagnosis not present

## 2022-04-30 DIAGNOSIS — N3000 Acute cystitis without hematuria: Secondary | ICD-10-CM | POA: Diagnosis not present

## 2022-04-30 DIAGNOSIS — N400 Enlarged prostate without lower urinary tract symptoms: Secondary | ICD-10-CM | POA: Diagnosis present

## 2022-04-30 DIAGNOSIS — G309 Alzheimer's disease, unspecified: Secondary | ICD-10-CM

## 2022-04-30 DIAGNOSIS — Z9181 History of falling: Secondary | ICD-10-CM

## 2022-04-30 DIAGNOSIS — B9620 Unspecified Escherichia coli [E. coli] as the cause of diseases classified elsewhere: Secondary | ICD-10-CM | POA: Diagnosis present

## 2022-04-30 DIAGNOSIS — U071 COVID-19: Secondary | ICD-10-CM | POA: Diagnosis not present

## 2022-04-30 DIAGNOSIS — F17210 Nicotine dependence, cigarettes, uncomplicated: Secondary | ICD-10-CM | POA: Diagnosis present

## 2022-04-30 DIAGNOSIS — F02A0 Dementia in other diseases classified elsewhere, mild, without behavioral disturbance, psychotic disturbance, mood disturbance, and anxiety: Secondary | ICD-10-CM | POA: Diagnosis present

## 2022-04-30 DIAGNOSIS — Z79899 Other long term (current) drug therapy: Secondary | ICD-10-CM

## 2022-04-30 DIAGNOSIS — D6959 Other secondary thrombocytopenia: Secondary | ICD-10-CM | POA: Diagnosis present

## 2022-04-30 DIAGNOSIS — I10 Essential (primary) hypertension: Secondary | ICD-10-CM | POA: Diagnosis present

## 2022-04-30 DIAGNOSIS — I482 Chronic atrial fibrillation, unspecified: Secondary | ICD-10-CM

## 2022-04-30 DIAGNOSIS — G4733 Obstructive sleep apnea (adult) (pediatric): Secondary | ICD-10-CM

## 2022-04-30 DIAGNOSIS — I4892 Unspecified atrial flutter: Secondary | ICD-10-CM | POA: Diagnosis present

## 2022-04-30 LAB — COMPREHENSIVE METABOLIC PANEL
ALT: 17 U/L (ref 0–44)
AST: 24 U/L (ref 15–41)
Albumin: 3.5 g/dL (ref 3.5–5.0)
Alkaline Phosphatase: 75 U/L (ref 38–126)
Anion gap: 11 (ref 5–15)
BUN: 42 mg/dL — ABNORMAL HIGH (ref 8–23)
CO2: 31 mmol/L (ref 22–32)
Calcium: 9.2 mg/dL (ref 8.9–10.3)
Chloride: 95 mmol/L — ABNORMAL LOW (ref 98–111)
Creatinine, Ser: 1.21 mg/dL (ref 0.61–1.24)
GFR, Estimated: 54 mL/min — ABNORMAL LOW (ref >60)
Glucose, Bld: 106 mg/dL — ABNORMAL HIGH (ref 70–99)
Potassium: 4 mmol/L (ref 3.5–5.1)
Sodium: 137 mmol/L (ref 135–145)
Total Bilirubin: 0.7 mg/dL (ref 0.3–1.2)
Total Protein: 6.7 g/dL (ref 6.5–8.1)

## 2022-04-30 LAB — URINALYSIS, COMPLETE (UACMP) WITH MICROSCOPIC
Bilirubin Urine: NEGATIVE
Glucose, UA: NEGATIVE mg/dL
Hgb urine dipstick: NEGATIVE
Ketones, ur: NEGATIVE mg/dL
Nitrite: POSITIVE — AB
Protein, ur: 100 mg/dL — AB
Specific Gravity, Urine: 1.014 (ref 1.005–1.030)
WBC, UA: >50 WBC/hpf (ref 0–5)
pH: 5 (ref 5.0–8.0)

## 2022-04-30 LAB — CBC WITH DIFFERENTIAL/PLATELET
Abs Immature Granulocytes: 0.06 10*3/uL (ref 0.00–0.07)
Basophils Absolute: 0 10*3/uL (ref 0.0–0.1)
Basophils Relative: 0 %
Eosinophils Absolute: 0 10*3/uL (ref 0.0–0.5)
Eosinophils Relative: 0 %
HCT: 38.7 % — ABNORMAL LOW (ref 39.0–52.0)
Hemoglobin: 12.6 g/dL — ABNORMAL LOW (ref 13.0–17.0)
Immature Granulocytes: 1 %
Lymphocytes Relative: 11 %
Lymphs Abs: 0.8 10*3/uL (ref 0.7–4.0)
MCH: 31.3 pg (ref 26.0–34.0)
MCHC: 32.6 g/dL (ref 30.0–36.0)
MCV: 96 fL (ref 80.0–100.0)
Monocytes Absolute: 1.1 10*3/uL — ABNORMAL HIGH (ref 0.1–1.0)
Monocytes Relative: 16 %
Neutro Abs: 5 10*3/uL (ref 1.7–7.7)
Neutrophils Relative %: 72 %
Platelets: 107 10*3/uL — ABNORMAL LOW (ref 150–400)
RBC: 4.03 MIL/uL — ABNORMAL LOW (ref 4.22–5.81)
RDW: 13.2 % (ref 11.5–15.5)
WBC: 7 10*3/uL (ref 4.0–10.5)
nRBC: 0 % (ref 0.0–0.2)

## 2022-04-30 LAB — PROTIME-INR
INR: 1.3 — ABNORMAL HIGH (ref 0.8–1.2)
Prothrombin Time: 15.6 seconds — ABNORMAL HIGH (ref 11.4–15.2)

## 2022-04-30 LAB — LACTIC ACID, PLASMA: Lactic Acid, Venous: 1.1 mmol/L (ref 0.5–1.9)

## 2022-04-30 LAB — RESP PANEL BY RT-PCR (RSV, FLU A&B, COVID)  RVPGX2
Influenza A by PCR: NEGATIVE
Influenza B by PCR: NEGATIVE
Resp Syncytial Virus by PCR: NEGATIVE
SARS Coronavirus 2 by RT PCR: POSITIVE — AB

## 2022-04-30 LAB — APTT: aPTT: 30 seconds (ref 24–36)

## 2022-04-30 MED ORDER — ADULT MULTIVITAMIN W/MINERALS CH
1.0000 | ORAL_TABLET | Freq: Every day | ORAL | Status: DC
  Administered 2022-05-01 – 2022-05-04 (×4): 1 via ORAL
  Filled 2022-04-30 (×4): qty 1

## 2022-04-30 MED ORDER — ONDANSETRON HCL 4 MG/2ML IJ SOLN: 4.0000 mg | Freq: Four times a day (QID) | INTRAMUSCULAR | Status: DC | PRN

## 2022-04-30 MED ORDER — HYDROCHLOROTHIAZIDE 12.5 MG PO TABS
12.5000 mg | ORAL_TABLET | Freq: Every day | ORAL | Status: DC
  Administered 2022-05-01 – 2022-05-04 (×4): 12.5 mg via ORAL
  Filled 2022-04-30 (×4): qty 1

## 2022-04-30 MED ORDER — DONEPEZIL HCL 5 MG PO TABS
10.0000 mg | ORAL_TABLET | Freq: Every day | ORAL | Status: DC
  Administered 2022-04-30 – 2022-05-03 (×4): 10 mg via ORAL
  Filled 2022-04-30 (×4): qty 2

## 2022-04-30 MED ORDER — LOSARTAN POTASSIUM-HCTZ 100-12.5 MG PO TABS: 1.0000 | ORAL_TABLET | Freq: Every day | ORAL | Status: DC

## 2022-04-30 MED ORDER — ACETAMINOPHEN 325 MG PO TABS: 650.0000 mg | ORAL_TABLET | Freq: Four times a day (QID) | ORAL | Status: DC | PRN

## 2022-04-30 MED ORDER — ENOXAPARIN SODIUM 40 MG/0.4ML IJ SOSY
40.0000 mg | PREFILLED_SYRINGE | INTRAMUSCULAR | Status: DC
  Administered 2022-04-30 – 2022-05-03 (×4): 40 mg via SUBCUTANEOUS
  Filled 2022-04-30 (×4): qty 0.4

## 2022-04-30 MED ORDER — NIRMATRELVIR/RITONAVIR (PAXLOVID) TABLET (RENAL DOSING)
2.0000 | ORAL_TABLET | Freq: Two times a day (BID) | ORAL | Status: DC
  Administered 2022-04-30 – 2022-05-04 (×8): 2 via ORAL
  Filled 2022-04-30: qty 20

## 2022-04-30 MED ORDER — FINASTERIDE 5 MG PO TABS
5.0000 mg | ORAL_TABLET | Freq: Every day | ORAL | Status: DC
  Administered 2022-05-01 – 2022-05-04 (×4): 5 mg via ORAL
  Filled 2022-04-30 (×4): qty 1

## 2022-04-30 MED ORDER — ONDANSETRON HCL 4 MG PO TABS: 4.0000 mg | ORAL_TABLET | Freq: Four times a day (QID) | ORAL | Status: DC | PRN

## 2022-04-30 MED ORDER — LOSARTAN POTASSIUM 50 MG PO TABS
100.0000 mg | ORAL_TABLET | Freq: Every day | ORAL | Status: DC
  Administered 2022-05-01 – 2022-05-04 (×4): 100 mg via ORAL
  Filled 2022-04-30 (×4): qty 2

## 2022-04-30 MED ORDER — GUAIFENESIN-DM 100-10 MG/5ML PO SYRP: 10.0000 mL | ORAL_SOLUTION | ORAL | Status: DC | PRN

## 2022-04-30 MED ORDER — SODIUM CHLORIDE 0.9% FLUSH
3.0000 mL | Freq: Two times a day (BID) | INTRAVENOUS | Status: DC
  Administered 2022-04-30 – 2022-05-04 (×7): 3 mL via INTRAVENOUS

## 2022-04-30 MED ORDER — FUROSEMIDE 20 MG PO TABS
10.0000 mg | ORAL_TABLET | Freq: Every day | ORAL | Status: DC
  Administered 2022-05-01 – 2022-05-04 (×4): 10 mg via ORAL
  Filled 2022-04-30 (×4): qty 1

## 2022-04-30 MED ORDER — ACETAMINOPHEN 650 MG RE SUPP: 650.0000 mg | Freq: Four times a day (QID) | RECTAL | Status: DC | PRN

## 2022-04-30 MED ORDER — SODIUM CHLORIDE 0.9 % IV SOLN
1.0000 g | Freq: Once | INTRAVENOUS | Status: AC
  Administered 2022-04-30: 1 g via INTRAVENOUS
  Filled 2022-04-30: qty 10

## 2022-04-30 MED ORDER — TAMSULOSIN HCL 0.4 MG PO CAPS: 0.4000 mg | ORAL_CAPSULE | Freq: Every day | ORAL | Status: DC

## 2022-04-30 MED ORDER — SODIUM CHLORIDE 0.9 % IV SOLN
1.0000 g | INTRAVENOUS | Status: DC
  Administered 2022-05-01 – 2022-05-03 (×3): 1 g via INTRAVENOUS
  Filled 2022-04-30: qty 10
  Filled 2022-04-30 (×3): qty 1

## 2022-04-30 MED ORDER — POLYETHYLENE GLYCOL 3350 17 G PO PACK: 17.0000 g | PACK | Freq: Every day | ORAL | Status: DC | PRN

## 2022-04-30 NOTE — Assessment & Plan Note (Signed)
-   Delirium precautions - Continue home Aricept

## 2022-04-30 NOTE — Assessment & Plan Note (Signed)
Prior history of atrial fibrillation, not on AC. Currently in atrial flutter, with rate control. Will need to discuss in the future the risks/benefit of AC.

## 2022-04-30 NOTE — Assessment & Plan Note (Signed)
Continue home antihypertensives 

## 2022-04-30 NOTE — Assessment & Plan Note (Signed)
Patient presenting with several days of increasing weakness, fatigue, fever and urinary incontinence.  Urinalysis consistent with UTI.  - Urine culture pending - Continue Rocephin 1 g daily - Bladder scan given suprapubic tenderness and fullness on exam - In and out if bladder scan with over 250 cc - Will need Foley catheter if multiple in and outs are required

## 2022-04-30 NOTE — ED Provider Notes (Signed)
[facility] Provider Note    Event Date/Time   First MD Initiated Contact with Patient 04/30/22 1613     (approximate)   History   Weakness and Fever   HPI  Robert [name] is a 89 y.o. male with a history of high blood pressure, who presents with fever, weakness which developed over the last 2 days.  His daughter reports that patient has been able to get up out of bed on his own.  No cough, decreased appetite.  No abdominal pain.  No diarrhea.  No vomiting.  No dysuria reported although daughter reports loss of urinary continence     Physical Exam   Triage Vital Signs: ED Triage Vitals [04/30/22 1609]  Enc Vitals Group     BP (!) 122/59     Pulse Rate 75     Resp 16     Temp (!) 100.7 F (38.2 C)     Temp Source Oral     SpO2 94 %     Weight      Height      Head Circumference      Peak Flow      Pain Score 0     Pain Loc      Pain Edu?      Excl. in [place]?     Most recent vital signs: Vitals:   04/30/22 1700 04/30/22 1800  BP: 111/61 115/62  Pulse: 72 72  Resp: 19 17  Temp:    SpO2: 94% 92%     General: Awake, no distress.  CV:  Good peripheral perfusion.  Resp:  Normal effort.  Clear to auscultation Abd:  No distention.  Soft, nontender Other:  No rashes noted   ED Results / Procedures / Treatments   Labs (all labs ordered are listed, but only abnormal results are displayed) Labs Reviewed  RESP PANEL BY RT-PCR (RSV, FLU A&B, COVID)  RVPGX2 - Abnormal; Notable for the following components:      Result Value   SARS Coronavirus 2 by RT PCR POSITIVE (*)    All other components within normal limits  COMPREHENSIVE METABOLIC PANEL - Abnormal; Notable for the following components:   Chloride 95 (*)    Glucose, Bld 106 (*)    BUN 42 (*)    GFR, Estimated 54 (*)    All other components within normal limits  CBC WITH DIFFERENTIAL/PLATELET - Abnormal; Notable for the following components:   RBC 4.03 (*)    Hemoglobin 12.6  (*)    HCT 38.7 (*)    Platelets 107 (*)    Monocytes Absolute 1.1 (*)    All other components within normal limits  PROTIME-INR - Abnormal; Notable for the following components:   Prothrombin Time 15.6 (*)    INR 1.3 (*)    All other components within normal limits  URINALYSIS, COMPLETE (UACMP) WITH MICROSCOPIC - Abnormal; Notable for the following components:   Color, Urine YELLOW (*)    APPearance CLOUDY (*)    Protein, ur 100 (*)    Nitrite POSITIVE (*)    Leukocytes,Ua MODERATE (*)    Bacteria, UA MANY (*)    All other components within normal limits  LACTIC ACID, PLASMA  APTT     EKG  ED ECG REPORT I, Lavonia Drafts, the attending physician, personally viewed and interpreted this ECG.  Date: 04/30/2022  Rhythm: Likely atrial flutter QRS Axis: normal Intervals: Abnormal ST/T Wave abnormalities: normal Narrative Interpretation: no evidence  of acute ischemia    RADIOLOGY Chest x-ray viewed interpreted by me, no pneumonia    PROCEDURES:  Critical Care performed:   Procedures   MEDICATIONS ORDERED IN ED: Medications  cefTRIAXone (ROCEPHIN) 1 g in sodium chloride 0.9 % 100 mL IVPB (has no administration in time range)     IMPRESSION / MDM / ASSESSMENT AND PLAN / ED COURSE  I reviewed the triage vital signs and the nursing notes. Patient's presentation is most consistent with acute presentation with potential threat to life or bodily function.   Patient presents with fever, weakness as detailed above.  Differential includes viral illness such as COVID or influenza, bacterial pneumonia, urinary tract infection  Will obtain labs, chest x-ray, urinalysis, respiratory PCR and reevaluate.  Lab work overall read assuring however PCR test has returned positive for COVID, this is likely the cause of his symptoms  Also his urinalysis is consistent with UTI, IV Rocephin ordered.  The patient is too weak to get up out of bed or ambulate on his own, he will require  admission to the hospital,      FINAL CLINICAL IMPRESSION(S) / ED DIAGNOSES   Final diagnoses:  COVID-19  Lower urinary tract infectious disease     Rx / DC Orders   ED Discharge Orders     None        Note:  This document was prepared using Dragon voice recognition software and may include unintentional dictation errors.   Lavonia Drafts, MD 04/30/22 [phone]

## 2022-04-30 NOTE — Assessment & Plan Note (Signed)
Previous CBC with platelets ranging in the 130s.  Currently 105, likely due to underlying viral infection.  - CBC in the a.m.

## 2022-04-30 NOTE — Assessment & Plan Note (Signed)
Patient with 1 day history of cough with an exposure approximately 4 to 5 days ago.  He is within the window for treatment with Paxlovid, as other symptoms noted above were likely secondary to UTI.  After a risk/benefit discussion, family would like a trial of Paxlovid.  - Paxlovid, renal dosing - Recheck BMP in the a.m.

## 2022-04-30 NOTE — H&P (Addendum)
History and Physical    [name] [id number] DOB: 1922-12-28 DOA: 04/30/2022 DOS: the patient was seen and examined on 04/30/2022 PCP: [name], MD  Patient coming from: Home  Chief Complaint:  Chief Complaint  Patient presents with   Weakness   Fever   HPI: Robert [name] is a 89 y.o. male with medical history significant of atrial fibrillation, hypertension, mild dementia, who presents to the ED due to fever and weakness.  History obtained from both patient and his daughter at bedside.  Robert [name] states that he has been feeling increasingly tired for the past 2-3 days.  He has been sleeping more often and reports a lack of appetite.  Family has also noticed a fever during this time.  Robert [name]'s daughter states that over this time, he has been having urinary incontinence, which is new.  In addition, he has developed a productive cough that began today.  Recent COVID exposure as his wife's home health nurse tested positive approximately 5 days ago.  His family has been testing at home, and he has been negative yesterday and the day prior.  Otherwise, patient denies chest pain, shortness of breath, palpitations, nausea, vomiting, abdominal pain.  Today, Robert [name] was not able to stand up on his own due to general weakness, and due to this he was brought to the ER.  ED course: On arrival to the ED, patient was febrile at 100.7 with blood pressure of 111/61 and heart rate of 72.  He was saturating at 94% on room air.  Initial workup remarkable for WBC of 7.0, hemoglobin of 12.6, platelets of 107, electrolytes within normal limits, creatinine of 1.21 and GFR 54.  Lactic acid within normal limits at 1.1.  INR elevated at 1.3.  Urinalysis was obtained that demonstrated moderate leukocytes, positive nitrites and many bacteria.  COVID-19 PCR positive. Chest x-ray was obtained that did not show any acute cardiopulmonary abnormalities.  Patient started on Rocephin  for UTI.  [facility] contacted for admission.  Review of Systems: As mentioned in the history of present illness. All other systems reviewed and are negative.  Past Medical History:  Diagnosis Date   Cancer ([place])    skin cancer   Hypertension    History reviewed. No pertinent surgical history.  Social History:  reports that he has been smoking cigarettes. He has never used smokeless tobacco. No history on file for alcohol use and drug use.  No Known Allergies  History reviewed. No pertinent family history.  Prior to Admission medications   Medication Sig Start Date End Date Taking? Authorizing Provider  finasteride (PROSCAR) 5 MG tablet Take 5 mg by mouth daily. 10/24/17   [provider]  furosemide (LASIX) 20 MG tablet TAKE 0.5 TABLETS (10 MG TOTAL) BY MOUTH ONCE DAILY 10/27/17   [provider]  losartan-hydrochlorothiazide (HYZAAR) 100-12.5 MG tablet Take 1 tablet by mouth daily. 09/07/17   [provider]  Multiple Vitamins-Minerals (MULTIVITAMIN WITH MINERALS) tablet Take by mouth. 03/25/15   [provider]  tamsulosin (FLOMAX) 0.4 MG CAPS capsule Take 0.4 mg by mouth daily. 08/31/17   [provider]    Physical Exam: Vitals:   04/30/22 1700 04/30/22 1800 04/30/22 1830 04/30/22 2016  BP: 111/61 115/62 99/62 123/69  Pulse: 72 72 72 72  Resp: 19 17  20  $ Temp:    98.7 F (37.1 C)  TempSrc:      SpO2: 94% 92% (!) 83% 96%  Weight:  76.8 kg  Height:    5' 8"$  (1.727 m)   Physical Exam Vitals and nursing note reviewed.  Constitutional:      General: He is not in acute distress.    Appearance: He is normal weight. He is not toxic-appearing.  HENT:     Head: Normocephalic and atraumatic.     Mouth/Throat:     Mouth: Mucous membranes are moist.     Pharynx: Oropharynx is clear.  Eyes:     Conjunctiva/sclera: Conjunctivae normal.     Pupils: Pupils are equal, round, and reactive to light.  Cardiovascular:     Rate and Rhythm: Normal  rate and regular rhythm.     Heart sounds: No murmur heard.    No gallop.     Comments: Trace pitting edema bilaterally Pulmonary:     Effort: Pulmonary effort is normal. No respiratory distress.     Breath sounds: Normal breath sounds. No wheezing, rhonchi or rales.  Abdominal:     General: Bowel sounds are normal.     Palpations: Abdomen is soft.     Tenderness: There is abdominal tenderness in the suprapubic area.  Skin:    General: Skin is warm and dry.  Neurological:     Mental Status: He is alert.     Comments:  Patient is alert and oriented to person, place, and situation but not time No focal weakness noted  Psychiatric:        Mood and Affect: Mood normal.        Behavior: Behavior normal.    Data Reviewed: CBC with WBC of 7.0, hemoglobin 12.6 and platelets of 107. CMP with sodium 137, potassium 4.0, chloride 95, bicarb 31, glucose 106, BUN 42, creatinine 1.21, AST 24, ALT 17 and GFR 54. Lactic acid within normal limits at 1.1 INR elevated at 1.3 PTT within normal limits at 30 Urinalysis with moderate leukocytes, positive nitrites, proteinuria, many bacteria and over 50 WBC/hpf COVID-19 PCR positive.  Influenza and RSV PCR negative  EKG personally reviewed.  Most consistent with atrial flutter with a right bundle branch block.  AV block is consistent, either 3-1 or 4-1.  No acute ST or T wave changes concerning for acute ischemia.  DG Chest Port 1 View  Result Date: 04/30/2022 CLINICAL DATA:  Sepsis. EXAM: PORTABLE CHEST 1 VIEW COMPARISON:  None Available. FINDINGS: Mild cardiac enlargement. Aortic atherosclerotic calcifications noted. No signs of pleural effusion or edema. No airspace opacities. Coarsened interstitial markings are noted bilaterally. IMPRESSION: No acute cardiopulmonary abnormalities. Aortic Atherosclerosis (ICD10-I70.0). Electronically Signed   By: Kerby Moors M.D.   On: 04/30/2022 16:45    There are no new results to review at this  time.  Assessment and Plan:  * Generalized weakness Patient was initially brought to the ED due to increasing generalized weakness that worsened today as patient was unable to get up and walk around on his own.  Likely multifactorial in the setting of UTI and COVID-19  - UTI and COVID-19 management as noted below - PT/OT  UTI (urinary tract infection) Patient presenting with several days of increasing weakness, fatigue, fever and urinary incontinence.  Urinalysis consistent with UTI.  - Urine culture pending - Continue Rocephin 1 g daily - Bladder scan given suprapubic tenderness and fullness on exam - In and out if bladder scan with over 250 cc - Will need Foley catheter if multiple in and outs are required  COVID-19 virus infection Patient with 1 day history of cough with  an exposure approximately 4 to 5 days ago.  He is within the window for treatment with Paxlovid, as other symptoms noted above were likely secondary to UTI.  After a risk/benefit discussion, family would like a trial of Paxlovid.  - Paxlovid, renal dosing - Recheck BMP in the a.m.  Thrombocytopenia (HCC) Previous CBC with platelets ranging in the 130s.  Currently 105, likely due to underlying viral infection.  - CBC in the a.m.  OSA on CPAP - CPAP at bedtime  Mild Alzheimer's dementia ([place]) - Delirium precautions - Continue home Aricept  Benign essential hypertension - Continue home antihypertensives  BPH (benign prostatic hyperplasia) At risk for urinary retention in the setting of UTI.  - Continue home Flomax - Bladder scans as noted above  Atrial fibrillation, chronic (HCC) Prior history of atrial fibrillation, not on AC. Currently in atrial flutter, with rate control. Will need to discuss in the future the risks/benefit of AC.   Advance Care Planning:   Code Status: Full Code.  When I discussed CODE STATUS with patient's daughter separately, she states that her and her sisters have talked in  the past that they would not want invasive measures that would prolong suffering.  After we discussed CPR, she states that she did not feel he could recover from that.  When I asked the patient what he would want, he stated he would want everything done, as he is trying to reach the age of 37.  Given the uncertainty, will remain as full code at this time and I encouraged the family to continue discussions regarding this  Consults: None  Family Communication: Patient's daughter updated at bedside  Severity of Illness: The appropriate patient status for this patient is OBSERVATION. Observation status is judged to be reasonable and necessary in order to provide the required intensity of service to ensure the patient's safety. The patient's presenting symptoms, physical exam findings, and initial radiographic and laboratory data in the context of their medical condition is felt to place them at decreased risk for further clinical deterioration. Furthermore, it is anticipated that the patient will be medically stable for discharge from the hospital within 2 midnights of admission.   Author: Jose Persia, MD 04/30/2022 8:48 PM  For on call review [email or web address].

## 2022-04-30 NOTE — Assessment & Plan Note (Signed)
-   CPAP at bedtime

## 2022-04-30 NOTE — ED Triage Notes (Signed)
Pt to ED via EMS from home for generalized weakness/fever/cough for the last 2 days. Pt is A&O x4, fever 100.7 F. Pt reports a negative COVID test on Wednesday.

## 2022-04-30 NOTE — Assessment & Plan Note (Signed)
Patient was initially brought to the ED due to increasing generalized weakness that worsened today as patient was unable to get up and walk around on his own.  Likely multifactorial in the setting of UTI and COVID-19  - UTI and COVID-19 management as noted below - PT/OT

## 2022-04-30 NOTE — Assessment & Plan Note (Signed)
At risk for urinary retention in the setting of UTI.  - Continue home Flomax - Bladder scans as noted above

## 2022-05-01 ENCOUNTER — Other Ambulatory Visit: Payer: Self-pay

## 2022-05-01 DIAGNOSIS — Z85828 Personal history of other malignant neoplasm of skin: Secondary | ICD-10-CM | POA: Diagnosis not present

## 2022-05-01 DIAGNOSIS — R32 Unspecified urinary incontinence: Secondary | ICD-10-CM | POA: Diagnosis present

## 2022-05-01 DIAGNOSIS — F02A0 Dementia in other diseases classified elsewhere, mild, without behavioral disturbance, psychotic disturbance, mood disturbance, and anxiety: Secondary | ICD-10-CM | POA: Diagnosis present

## 2022-05-01 DIAGNOSIS — I4892 Unspecified atrial flutter: Secondary | ICD-10-CM | POA: Diagnosis present

## 2022-05-01 DIAGNOSIS — N390 Urinary tract infection, site not specified: Secondary | ICD-10-CM | POA: Diagnosis present

## 2022-05-01 DIAGNOSIS — N3000 Acute cystitis without hematuria: Secondary | ICD-10-CM | POA: Diagnosis not present

## 2022-05-01 DIAGNOSIS — U071 COVID-19: Secondary | ICD-10-CM | POA: Diagnosis present

## 2022-05-01 DIAGNOSIS — Z79899 Other long term (current) drug therapy: Secondary | ICD-10-CM | POA: Diagnosis not present

## 2022-05-01 DIAGNOSIS — G4733 Obstructive sleep apnea (adult) (pediatric): Secondary | ICD-10-CM | POA: Diagnosis present

## 2022-05-01 DIAGNOSIS — B9620 Unspecified Escherichia coli [E. coli] as the cause of diseases classified elsewhere: Secondary | ICD-10-CM | POA: Diagnosis present

## 2022-05-01 DIAGNOSIS — G309 Alzheimer's disease, unspecified: Secondary | ICD-10-CM | POA: Diagnosis present

## 2022-05-01 DIAGNOSIS — R531 Weakness: Secondary | ICD-10-CM | POA: Diagnosis not present

## 2022-05-01 DIAGNOSIS — N400 Enlarged prostate without lower urinary tract symptoms: Secondary | ICD-10-CM | POA: Diagnosis present

## 2022-05-01 DIAGNOSIS — D6959 Other secondary thrombocytopenia: Secondary | ICD-10-CM | POA: Diagnosis present

## 2022-05-01 DIAGNOSIS — I482 Chronic atrial fibrillation, unspecified: Secondary | ICD-10-CM | POA: Diagnosis present

## 2022-05-01 DIAGNOSIS — I10 Essential (primary) hypertension: Secondary | ICD-10-CM | POA: Diagnosis present

## 2022-05-01 DIAGNOSIS — Z9181 History of falling: Secondary | ICD-10-CM | POA: Diagnosis not present

## 2022-05-01 DIAGNOSIS — F17210 Nicotine dependence, cigarettes, uncomplicated: Secondary | ICD-10-CM | POA: Diagnosis present

## 2022-05-01 LAB — CBC
HCT: 35.2 % — ABNORMAL LOW (ref 39.0–52.0)
Hemoglobin: 11.8 g/dL — ABNORMAL LOW (ref 13.0–17.0)
MCH: 31.6 pg (ref 26.0–34.0)
MCHC: 33.5 g/dL (ref 30.0–36.0)
MCV: 94.1 fL (ref 80.0–100.0)
Platelets: 100 10*3/uL — ABNORMAL LOW (ref 150–400)
RBC: 3.74 MIL/uL — ABNORMAL LOW (ref 4.22–5.81)
RDW: 13.5 % (ref 11.5–15.5)
WBC: 5.9 10*3/uL (ref 4.0–10.5)
nRBC: 0 % (ref 0.0–0.2)

## 2022-05-01 LAB — BASIC METABOLIC PANEL
Anion gap: 7 (ref 5–15)
BUN: 41 mg/dL — ABNORMAL HIGH (ref 8–23)
CO2: 28 mmol/L (ref 22–32)
Calcium: 8.7 mg/dL — ABNORMAL LOW (ref 8.9–10.3)
Chloride: 102 mmol/L (ref 98–111)
Creatinine, Ser: 1.09 mg/dL (ref 0.61–1.24)
GFR, Estimated: >60 mL/min (ref >60)
Glucose, Bld: 85 mg/dL (ref 70–99)
Potassium: 3.5 mmol/L (ref 3.5–5.1)
Sodium: 137 mmol/L (ref 135–145)

## 2022-05-01 LAB — C-REACTIVE PROTEIN: CRP: 4.2 mg/dL — ABNORMAL HIGH (ref <1.0)

## 2022-05-01 NOTE — Progress Notes (Signed)
PROGRESS NOTE    Robert [name]  [id number] DOB: 1923/02/04 DOA: 04/30/2022 PCP: [name], MD   Brief Narrative:  This 89 yrs old Male with PMH significant for atrial fibrillation, hypertension, mild dementia presented in the ED with fever and generalized weakness. Patient reports he has been feeling increasingly tired for the last 2 to 3 days.  He has been sleeping more often and reports lack of appetite. Patient also has developed mild URI symptoms including productive cough.  Family reports positive COVID exposure as patient's wife's home health nurse tested + 5 days ago.  Patient is not able to stand up on his own due to generalized weakness.  He was febrile on arrival with a temp 100.7, SpO2 94% on room air.  UA was positive for UTI.  COVID PCR positive, Chest x-ray did not show any acute cardiopulmonary abnormalities.  Patient is started on IV Rocephin for UTI and Paxlovid for COVID.  Assessment & Plan:   Principal Problem:   Generalized weakness Active Problems:   UTI (urinary tract infection)   COVID-19 virus infection   Thrombocytopenia (HCC)   OSA on CPAP   Mild Alzheimer's dementia (HCC)   Benign essential hypertension   BPH (benign prostatic hyperplasia)   Atrial fibrillation, chronic (HCC)  Generalized weakness: Patient presented with generalized weakness, Patient was unable to get up and walk around on his own. Likely multifactorial in the setting of UTI and COVID-19. Continue IV ceftriaxone for UTI,  follow-up urine culture. Continue Paxloid IV for 5 days. Continue airborne isolation, supportive care. PT and OT eval.   Urinary tract infection: Patient presented with several days of increasing weakness, fatigue, fever, urinary incontinence. UA consistent with UTI. Continue empiric ceftriaxone 1 g daily Follow-up urine culture.   COVID-19 virus infection: Patient reports productive cough following exposure approximately 4 to 5 days ago. He is within  the window for treatment with Paxlovid, as other symptoms noted above were likely secondary to UTI.   Continue Paxloid for 5 days.   Thrombocytopenia: Likely secondary to COVID infection.   Continue to monitor CBC   OSA on CPAP Continue CPAP at bedtime.   Mild Alzheimer's dementia ([place]) Delirium precautions Continue home Aricept   Essential hypertension: Continue hydrochlorothiazide and Cozaar.   BPH (benign prostatic hyperplasia) Continue Flomax Bladder scans as noted above   Atrial fibrillation, chronic ([place]): Heart rate well-controlled. Need to discuss with patient in detail about the risk of anticoagulation.   DVT prophylaxis: Lovenox Code Status: Full code Family Communication: No family at bed side. Disposition Plan:    Status is: Inpatient Remains inpatient appropriate because: Admitted for generalized weakness likely multifactorial in the setting of UTI and COVID-19 infection.  Patient is started on ceftriaxone and .  Urine culture is pending.    Consultants:  None  Procedures: None Antimicrobials:  Anti-infectives (From admission, onward)    Start     Dose/Rate Route Frequency Ordered Stop   05/01/22 1000  cefTRIAXone (ROCEPHIN) 1 g in sodium chloride 0.9 % 100 mL IVPB        1 g 200 mL/hr over 30 Minutes Intravenous Every 24 hours 04/30/22 1959     04/30/22 2200  nirmatrelvir/ritonavir (renal dosing) (PAXLOVID) 2 tablet        2 tablet Oral 2 times daily 04/30/22 1939 05/05/22 2159   04/30/22 1845  cefTRIAXone (ROCEPHIN) 1 g in sodium chloride 0.9 % 100 mL IVPB        1 g 200  mL/hr over 30 Minutes Intravenous  Once 04/30/22 1830 04/30/22 1935      Subjective: Patient was seen and examined at bedside.  Overnight events noted.   Patient reports doing slightly better. Still reports feeling generally weak and tired and fatigued.  Objective: Vitals:   05/01/22 0042 05/01/22 0558 05/01/22 0742 05/01/22 1238  BP: 116/71 126/71 131/73 128/64  Pulse: 87  81 80 83  Resp: 20 18 18 16  $ Temp: 99.8 F (37.7 C) 99 F (37.2 C) 99.4 F (37.4 C) 99.1 F (37.3 C)  TempSrc: Oral Oral    SpO2: 95% 98% 98% 97%  Weight:      Height:        Intake/Output Summary (Last 24 hours) at 05/01/2022 1324 Last data filed at 05/01/2022 0743 Gross per 24 hour  Intake 240 ml  Output 650 ml  Net -410 ml   Filed Weights   04/30/22 2016  Weight: 76.8 kg    Examination:  General exam: Appears calm and comfortable, not in any acute distress. Respiratory system: Clear to auscultation. Respiratory effort normal.  RR 14 Cardiovascular system: S1 & S2 heard, RRR. No JVD, murmurs, rubs, gallops or clicks. No pedal edema. Gastrointestinal system: Abdomen is soft, non tender, non distended, BS+ Central nervous system: Alert and oriented x 1. No focal neurological deficits. Extremities: No edema, no cyanosis, no clubbing Skin: No rashes, lesions or ulcers Psychiatry: Judgement and insight appear normal. Mood & affect appropriate.     Data Reviewed: I have personally reviewed following labs and imaging studies  CBC: Recent Labs  Lab 04/30/22 1634 05/01/22 0557  WBC 7.0 5.9  NEUTROABS 5.0  --   HGB 12.6* 11.8*  HCT 38.7* 35.2*  MCV 96.0 94.1  PLT 107* [id number]*   Basic Metabolic Panel: Recent Labs  Lab 04/30/22 1634 05/01/22 0557  NA 137 137  K 4.0 3.5  CL 95* 102  CO2 31 28  GLUCOSE 106* 85  BUN 42* 41*  CREATININE 1.21 1.09  CALCIUM 9.2 8.7*   GFR: Estimated Creatinine Clearance: 37.5 mL/min (by C-G formula based on SCr of 1.09 mg/dL). Liver Function Tests: Recent Labs  Lab 04/30/22 1634  AST 24  ALT 17  ALKPHOS 75  BILITOT 0.7  PROT 6.7  ALBUMIN 3.5   No results for input(s): "LIPASE", "AMYLASE" in the last 168 hours. No results for input(s): "AMMONIA" in the last 168 hours. Coagulation Profile: Recent Labs  Lab 04/30/22 1634  INR 1.3*   Cardiac Enzymes: No results for input(s): "CKTOTAL", "CKMB", "CKMBINDEX", "TROPONINI" in  the last 168 hours. BNP (last 3 results) No results for input(s): "PROBNP" in the last 8760 hours. HbA1C: No results for input(s): "HGBA1C" in the last 72 hours. CBG: No results for input(s): "GLUCAP" in the last 168 hours. Lipid Profile: No results for input(s): "CHOL", "HDL", "LDLCALC", "TRIG", "CHOLHDL", "LDLDIRECT" in the last 72 hours. Thyroid Function Tests: No results for input(s): "TSH", "T4TOTAL", "FREET4", "T3FREE", "THYROIDAB" in the last 72 hours. Anemia Panel: No results for input(s): "VITAMINB12", "FOLATE", "FERRITIN", "TIBC", "IRON", "RETICCTPCT" in the last 72 hours. Sepsis Labs: Recent Labs  Lab 04/30/22 1634  LATICACIDVEN 1.1    Recent Results (from the past 240 hour(s))  Resp panel by RT-PCR (RSV, Flu A&B, Covid) Anterior Nasal Swab     Status: Abnormal   Collection Time: 04/30/22  4:34 PM   Specimen: Anterior Nasal Swab  Result Value Ref Range Status   SARS Coronavirus 2 by RT PCR POSITIVE (A)  NEGATIVE Final    Comment: (NOTE) SARS-CoV-2 target nucleic acids are DETECTED.  The SARS-CoV-2 RNA is generally detectable in upper respiratory specimens during the acute phase of infection. Positive results are indicative of the presence of the identified virus, but do not rule out bacterial infection or co-infection with other pathogens not detected by the test. Clinical correlation with patient history and other diagnostic information is necessary to determine patient infection status. The expected result is Negative.  Fact Sheet for Patients: EntrepreneurPulse.com.au  Fact Sheet for Healthcare Providers: IncredibleEmployment.be  This test is not yet approved or cleared by the [place] FDA and  has been authorized for detection and/or diagnosis of SARS-CoV-2 by FDA under an Emergency Use Authorization (EUA).  This EUA will remain in effect (meaning this test can be used) for the duration of  the COVID-19 declaration  under Section 564(b)(1) of the A ct, 21 U.S.C. section 360bbb-3(b)(1), unless the authorization is terminated or revoked sooner.     Influenza A by PCR NEGATIVE NEGATIVE Final   Influenza B by PCR NEGATIVE NEGATIVE Final    Comment: (NOTE) The Xpert Xpress SARS-CoV-2/FLU/RSV plus assay is intended as an aid in the diagnosis of influenza from Nasopharyngeal swab specimens and should not be used as a sole basis for treatment. Nasal washings and aspirates are unacceptable for Xpert Xpress SARS-CoV-2/FLU/RSV testing.  Fact Sheet for Patients: EntrepreneurPulse.com.au  Fact Sheet for Healthcare Providers: IncredibleEmployment.be  This test is not yet approved or cleared by the [place] FDA and has been authorized for detection and/or diagnosis of SARS-CoV-2 by FDA under an Emergency Use Authorization (EUA). This EUA will remain in effect (meaning this test can be used) for the duration of the COVID-19 declaration under Section 564(b)(1) of the Act, 21 U.S.C. section 360bbb-3(b)(1), unless the authorization is terminated or revoked.     Resp Syncytial Virus by PCR NEGATIVE NEGATIVE Final    Comment: (NOTE) Fact Sheet for Patients: EntrepreneurPulse.com.au  Fact Sheet for Healthcare Providers: IncredibleEmployment.be  This test is not yet approved or cleared by the [place] FDA and has been authorized for detection and/or diagnosis of SARS-CoV-2 by FDA under an Emergency Use Authorization (EUA). This EUA will remain in effect (meaning this test can be used) for the duration of the COVID-19 declaration under Section 564(b)(1) of the Act, 21 U.S.C. section 360bbb-3(b)(1), unless the authorization is terminated or revoked.  Performed at [facility], [address], [place], [place] [address]     Radiology Studies: [facility] Chest [place] 1 View  Result Date: 04/30/2022 CLINICAL DATA:  Sepsis.  EXAM: PORTABLE CHEST 1 VIEW COMPARISON:  None Available. FINDINGS: Mild cardiac enlargement. Aortic atherosclerotic calcifications noted. No signs of pleural effusion or edema. No airspace opacities. Coarsened interstitial markings are noted bilaterally. IMPRESSION: No acute cardiopulmonary abnormalities. Aortic Atherosclerosis (ICD10-I70.0). Electronically Signed   By: Kerby Moors M.D.   On: 04/30/2022 16:45    Scheduled Meds:  donepezil  10 mg Oral QHS   enoxaparin (LOVENOX) injection  40 mg Subcutaneous Q24H   finasteride  5 mg Oral Daily   furosemide  10 mg Oral Daily   hydrochlorothiazide  12.5 mg Oral Daily   losartan  100 mg Oral Daily   multivitamin with minerals  1 tablet Oral Daily   nirmatrelvir/ritonavir (renal dosing)  2 tablet Oral BID   sodium chloride flush  3 mL Intravenous Q12H   [START ON 05/06/2022] tamsulosin  0.4 mg Oral QPC supper   Continuous Infusions:  cefTRIAXone (ROCEPHIN)  IV 1 g (05/01/22 1135)     LOS: 0 days    Time spent: 50 mins    Hamp Moreland, MD [facility]   If 7PM-7AM, please contact night-coverage

## 2022-05-01 NOTE — Evaluation (Signed)
Physical Therapy Evaluation Patient Details Name: Robert [name] MRN: [id number] DOB: 1922-04-09 Today's Date: 05/01/2022  History of Present Illness  89 y.o. male with medical history significant of atrial fibrillation, hypertension, mild dementia, who presents to the ED due to fever and weakness.  Pt found to be Covid+.  Clinical Impression  Pt very pleasant and ultimately did very well with PT exam.  On room air t/o the session with SpO2 in the mid/high 90s, no SOB or c/o excessive fatigue.  He displayed slow but safe ambulation with walker (~35 ft, likely could have done much more but wished to have breakfast after using the bathroom where he was able to clean himself and wash hands w/o assist).  Pt has not had any falls, apparently has aides or family in the home >12 hrs/day.  Pt did well but is not at his baseline (where he uses [facility]), recommending HHPT to address mild weakness and unsteadiness issues but should be safe to return home with the current assistance that is available.     Recommendations for follow up therapy are one component of a multi-disciplinary discharge planning process, led by the attending physician.  Recommendations may be updated based on patient status, additional functional criteria and insurance authorization.  Follow Up Recommendations Home health PT      Assistance Recommended at Discharge Intermittent Supervision/Assistance  Patient can return home with the following  Assistance with cooking/housework;Assist for transportation    Equipment Recommendations None recommended by PT  Recommendations for Other Services       Functional Status Assessment Patient has had a recent decline in their functional status and demonstrates the ability to make significant improvements in function in a reasonable and predictable amount of time.     Precautions / Restrictions Precautions Precautions: Fall Restrictions Weight Bearing Restrictions: No       Mobility  Bed Mobility Overal bed mobility: Independent                  Transfers Overall transfer level: Modified independent Equipment used: Rolling walker (2 wheels)               General transfer comment: Pt able to rise to standing w/o assist, minimal cuing, definite need for UEs    Ambulation/Gait Ambulation/Gait assistance: Modified independent (Device/Increase time) Gait Distance (Feet): 35 Feet Assistive device: Rolling walker (2 wheels)         General Gait Details: Pt with minimal reliance on the walker, no LOBs or fatigue (O2 in the high 90s on room air after ambulation/bathroom use).  He did tend to maintain slightly bent knees with shortened step length but no buckling or safety concerns. Overall showed safe and appropriate gait.  (Likely could have walked considerably more but was interested in getting to recliner and having breakfast.)  Stairs            Wheelchair Mobility    Modified Rankin (Stroke Patients Only)       Balance Overall balance assessment: Modified Independent (able to maintain balance w/o UEs to wash/dry hands after using the restroom (BM))                                           Pertinent Vitals/Pain Pain Assessment Pain Assessment: No/denies pain    Home Living Family/patient expects to be discharged to:: Private residence Living Arrangements: Spouse/significant other Available  Help at Discharge: Personal care attendant;Family (aides and family >12 hrs/day)   Home Access: Ramped entrance       Home Layout: One level Home Equipment: Conservation officer, nature (2 wheels);Cane - single point      Prior Function Prior Level of Function : Independent/Modified Independent             Mobility Comments: Pt typically able to be active (does not drive anymore) with just a cane, but per son-in-law has decreased his level of activity significantly in the last several months and has started using  walker       Hand Dominance        Extremity/Trunk Assessment   Upper Extremity Assessment Upper Extremity Assessment:  (age appropriate deficits, arthritic hand posturing)    Lower Extremity Assessment Lower Extremity Assessment: Overall WFL for tasks assessed (age appropriate)       Communication   Communication: HOH  Cognition Arousal/Alertness: Awake/alert Behavior During Therapy: WFL for tasks assessed/performed Overall Cognitive Status: Within Functional Limits for tasks assessed                                 General Comments: HOH makes some communication difficult, but ultimately alert and aware of his situation/setting        General Comments General comments (skin integrity, edema, etc.): Pt safe and confident with in-room mobility with walker and though he does endorse being a little weaker than his baseline he feels close and    Exercises     Assessment/Plan    PT Assessment Patient needs continued PT services  PT Problem List Decreased strength;Decreased activity tolerance;Decreased balance;Decreased mobility;Decreased knowledge of use of DME;Decreased safety awareness;Decreased knowledge of precautions;Decreased range of motion       PT Treatment Interventions DME instruction;Gait training;Functional mobility training;Therapeutic activities;Neuromuscular re-education;Balance training;Therapeutic exercise;Patient/family education    PT Goals (Current goals can be found in the Care Plan section)  Acute Rehab PT Goals Patient Stated Goal: go home PT Goal Formulation: With patient Time For Goal Achievement: 05/14/22 Potential to Achieve Goals: Fair    Frequency Min 2X/week     Co-evaluation               AM-PAC PT "6 Clicks" Mobility  Outcome Measure Help needed turning from your back to your side while in a flat bed without using bedrails?: None Help needed moving from lying on your back to sitting on the side of a flat bed  without using bedrails?: None Help needed moving to and from a bed to a chair (including a wheelchair)?: None Help needed standing up from a chair using your arms (e.g., wheelchair or bedside chair)?: None Help needed to walk in hospital room?: A Little Help needed climbing 3-5 steps with a railing? : A Little 6 Click Score: 22    End of Session Equipment Utilized During Treatment: Gait belt Activity Tolerance: Patient tolerated treatment well Patient left: with call bell/phone within reach;with chair alarm set;with family/visitor present;with nursing/sitter in room Nurse Communication: Mobility status PT Visit Diagnosis: Muscle weakness (generalized) (M62.81);Other abnormalities of gait and mobility (R26.89)    Time: [id number] PT Time Calculation (min) (ACUTE ONLY): 28 min   Charges:   PT Evaluation $PT Eval Low Complexity: 1 Low PT Treatments $Gait Training: 8-22 mins        Kreg Shropshire, DPT 05/01/2022, 11:31 AM

## 2022-05-01 NOTE — Evaluation (Signed)
Occupational Therapy Evaluation Patient Details Name: Robert [name] MRN: [id number] DOB: [birth year] Today's Date: 05/01/2022   History of Present Illness 89 y.o. male with medical history significant of atrial fibrillation, hypertension, mild dementia, who presents to the ED due to fever and weakness.  Pt found to be Covid+.   Clinical Impression   Patient received for OT evaluation. See flowsheet below for details of function. Generally, patient MOD (I) for bed mobility, MOD (I)/supervision with RW for functional mobility, and MIN A-set up for ADLs. Patient will benefit from continued OT while in acute care.       Recommendations for follow up therapy are one component of a multi-disciplinary discharge planning process, led by the attending physician.  Recommendations may be updated based on patient status, additional functional criteria and insurance authorization.   Follow Up Recommendations  Home health OT     Assistance Recommended at Discharge Intermittent Supervision/Assistance  Patient can return home with the following Assistance with cooking/housework;Direct supervision/assist for medications management;Direct supervision/assist for financial management;Assist for transportation;Help with stairs or ramp for entrance    Functional Status Assessment  Patient has had a recent decline in their functional status and demonstrates the ability to make significant improvements in function in a reasonable and predictable amount of time.  Equipment Recommendations  Tub/shower seat    Recommendations for Other Services       Precautions / Restrictions Precautions Precautions: Fall Precaution Comments: Isolation- Covid+ Restrictions Weight Bearing Restrictions: No      Mobility Bed Mobility Overal bed mobility: Independent (with extra time)                  Transfers Overall transfer level: Modified independent Equipment used: Rolling walker (2 wheels)                General transfer comment: Using BIL UE's on RW with sit to stand, so limited carry-over from earlier PT session; will benefit from HHPT for mobility training with RW.      Balance Overall balance assessment: Modified Independent (supervision using RW during mobility; pt declining sink/bathroom ADLs at this time, but per PT report pt able to stand and wash hands at sink with BIL UE off RW after doing toilet hygiene for bowel without loss of balance.)                                         ADL either performed or assessed with clinical judgement   ADL Overall ADL's : Needs assistance/impaired                                       General ADL Comments: Pt appearing likely very close to baseline; using RW today for funcitonal mobility; mostly supervision, although one instance of LOB while turning that pt self-corrected with RW and OT provided CGA for safety. Pt struggling to put his socks on today (unable to lean fully forward or make figure four), as pt trying to put his heel on the bedframe as he does at home to lift his foot, but unable to keep foot from sliding off; anticipate in his home environment he will have improved functional status; able to don his slip on shoes with set up. Anticipate pt will have decreased activity tolerance from baseline and would benefit from  HHOT to evaluate ADLs/IADLs in the home environment. Pt did have instance of urinary incontinence once seated at EOB after mobility; did not seem to notice that it was incontinence (blamed it on purewick malfunction, although OT had alerted pt that he was disconnected from [place] prior to mobility); OT cleaned urine from floor).     Vision Baseline Vision/History: 1 Wears glasses       Perception     Praxis      Pertinent Vitals/Pain Pain Assessment Pain Assessment: No/denies pain     Hand Dominance     Extremity/Trunk Assessment Upper Extremity Assessment Upper  Extremity Assessment: Overall WFL for tasks assessed (Pt with trigger fingers on BIL hands)   Lower Extremity Assessment Lower Extremity Assessment: Overall WFL for tasks assessed       Communication Communication Communication: HOH   Cognition Arousal/Alertness: Awake/alert Behavior During Therapy: WFL for tasks assessed/performed Overall Cognitive Status: Within Functional Limits for tasks assessed                                 General Comments: Pt generally alert and aware of situation. Some details of pt's report of PLOF differ from what PT was told by son. Pt has Alzehimers dx in his chart. Able to follow all commands during OT today; very pleasant and agreeable.     General Comments  Pt walked with RW supervision with OT in room (OT managing IV line) approx 150 feet (in circles to the door and back into room); only one mild loss of balance pt self corrected.    Exercises     Shoulder Instructions      Home Living Family/patient expects to be discharged to:: Private residence Living Arrangements: Spouse/significant other Available Help at Discharge: Personal care attendant;Family (aide from about 8am-8pm (mostly for wife); pt has 7 children living locally) Type of Home: House Home Access: Ramped entrance     Home Layout: One level     Bathroom Shower/Tub: Occupational psychologist: Standard     Home Equipment: Conservation officer, nature (2 wheels);Cane - single point;Hand held shower head;Grab bars - tub/shower          Prior Functioning/Environment Prior Level of Function : Independent/Modified Independent             Mobility Comments: Per pt report, using cane for a few years; pt denies RW use (however in PT assessment son was present and stated that pt using RW recently). Pt states that in the past 1 month his mobility further declined. He denies falls. (Son not in room during OT session to verify.) ADLs Comments: Pt states he is (I) with  ADLs; he uses the wooden frame of the bed to prop his heel on while donning socks at baseline. pt states that a few years ago he used to enjoy gardening; has been unable to do so the past few summers; he does enjoy reading mysteries, crosswords, and watching the news. Pt no longer drives. There is an aide assisting in the home (mostly for pt's wife) 12 hours a day; pt has 7 local children and one in [place]; children are involved in his care.        OT Problem List: Impaired balance (sitting and/or standing);Decreased strength;Decreased activity tolerance      OT Treatment/Interventions: Self-care/ADL training;Therapeutic exercise;Therapeutic activities    OT Goals(Current goals can be found in the care plan section) Acute Rehab  OT Goals Patient Stated Goal: Get better; go home OT Goal Formulation: With patient Time For Goal Achievement: 05/15/22 Potential to Achieve Goals: Good ADL Goals Pt Will Perform Grooming: with modified independence;standing Pt Will Perform Lower Body Dressing: sit to/from stand;with modified independence Pt Will Perform Toileting - Clothing Manipulation and hygiene: with modified independence;sit to/from stand  OT Frequency: Min 2X/week    Co-evaluation              AM-PAC OT "6 Clicks" Daily Activity     Outcome Measure Help from another person eating meals?: None Help from another person taking care of personal grooming?: None Help from another person toileting, which includes using toliet, bedpan, or urinal?: None Help from another person bathing (including washing, rinsing, drying)?: None Help from another person to put on and taking off regular upper body clothing?: None Help from another person to put on and taking off regular lower body clothing?: A Little 6 Click Score: 23   End of Session Equipment Utilized During Treatment: Rolling walker (2 wheels) Nurse Communication: Mobility status  Activity Tolerance: Patient tolerated treatment  well Patient left: in bed;with call bell/phone within reach;with bed alarm set  OT Visit Diagnosis: Muscle weakness (generalized) (M62.81)                Time: [id number] OT Time Calculation (min): 42 min Charges:  OT General Charges $OT Visit: 1 Visit OT Evaluation $OT Eval Moderate Complexity: 1 Mod OT Treatments $Self Care/Home Management : 8-22 mins $Therapeutic Activity: 8-22 mins  Waymon Amato, MS, OTR/L   Vania Rea 05/01/2022, 3:34 PM

## 2022-05-02 DIAGNOSIS — R531 Weakness: Secondary | ICD-10-CM | POA: Diagnosis not present

## 2022-05-02 LAB — C-REACTIVE PROTEIN: CRP: 5.5 mg/dL — ABNORMAL HIGH (ref <1.0)

## 2022-05-02 NOTE — Progress Notes (Signed)
PROGRESS NOTE    Robert [name]  [id number] DOB: 03-Mar-1923 DOA: 04/30/2022 PCP: [name], MD   Brief Narrative:  This 89 yrs old Male with PMH significant for atrial fibrillation, hypertension, mild dementia presented in the ED with fever and generalized weakness. Patient reports he has been feeling increasingly tired for the last 2 to 3 days.  He has been sleeping more often and reports lack of appetite. Patient also has developed mild URI symptoms including productive cough.  Family reports positive COVID exposure as patient's wife's home health nurse tested + 5 days ago.  Patient is not able to stand up on his own due to generalized weakness.  He was febrile on arrival with a temp 100.7, SpO2 94% on room air.  UA was positive for UTI.  COVID PCR positive, Chest x-ray did not show any acute cardiopulmonary abnormalities.  Patient is started on IV Rocephin for UTI and Paxlovid for COVID.  Assessment & Plan:   Principal Problem:   Generalized weakness Active Problems:   UTI (urinary tract infection)   COVID-19 virus infection   Thrombocytopenia (HCC)   OSA on CPAP   Mild Alzheimer's dementia (HCC)   Benign essential hypertension   BPH (benign prostatic hyperplasia)   Atrial fibrillation, chronic (HCC)  Generalized weakness: Patient presented with generalized weakness, Patient was unable to get up and walk around on his own. Likely multifactorial in the setting of UTI and COVID-19. Continue IV ceftriaxone for UTI, culture grew E. coli sensitivity pending. Continue Paxloid PO for 5 days. Continue airborne isolation, supportive care. PT and OT eval.   Urinary tract infection: Patient presented with several days of increasing weakness, fatigue, fever, urinary incontinence. UA consistent with UTI. Continue empiric ceftriaxone 1 g daily Urine culture grew E. Coli,  sensitivity pending.   COVID-19 virus infection: Patient reports productive cough following exposure  approximately 4 to 5 days ago. He is within the window for treatment with Paxlovid, Continue Paxloid for 5 days.   Thrombocytopenia: Likely secondary to COVID infection.   Continue to monitor CBC   OSA on CPAP Continue CPAP at bedtime.   Mild Alzheimer's dementia ([place]) Delirium precautions Continue home Aricept   Essential hypertension: Continue hydrochlorothiazide and Cozaar.   BPH (benign prostatic hyperplasia) Continue Flomax Bladder scans as noted above   Atrial fibrillation, chronic ([place]): Heart rate well-controlled. Need to discuss with patient in detail about the risk of anticoagulation. Not a suitable candidate for anticoagulation given risk of falls and age.   DVT prophylaxis: Lovenox Code Status: Full code Family Communication: No family at bed side. Disposition Plan:    Status is: Inpatient Remains inpatient appropriate because: Admitted for generalized weakness likely multifactorial in the setting of UTI and COVID-19 infection.  Patient is started on ceftriaxone and Urine culture is pending.    Consultants:  None  Procedures: None Antimicrobials:  Anti-infectives (From admission, onward)    Start     Dose/Rate Route Frequency Ordered Stop   05/01/22 1000  cefTRIAXone (ROCEPHIN) 1 g in sodium chloride 0.9 % 100 mL IVPB        1 g 200 mL/hr over 30 Minutes Intravenous Every 24 hours 04/30/22 1959     04/30/22 2200  nirmatrelvir/ritonavir (renal dosing) (PAXLOVID) 2 tablet        2 tablet Oral 2 times daily 04/30/22 1939 05/05/22 2159   04/30/22 1845  cefTRIAXone (ROCEPHIN) 1 g in sodium chloride 0.9 % 100 mL IVPB  1 g 200 mL/hr over 30 Minutes Intravenous  Once 04/30/22 1830 04/30/22 1935      Subjective: Patient was seen and examined at bedside. Overnight events noted.   Patient reports feeling slightly better. Still reports feeling weak and tired and fatigued. He reports decreased urinary pain.  Objective: Vitals:   05/02/22 0550  05/02/22 0756 05/02/22 0929 05/02/22 1129  BP: 133/80 (!) 140/78 139/73 (!) 146/73  Pulse: 76 72 77 80  Resp: 16 18  18  $ Temp: 97.6 F (36.4 C) 97.6 F (36.4 C)    TempSrc: Oral     SpO2: 97% 99%  99%  Weight:      Height:        Intake/Output Summary (Last 24 hours) at 05/02/2022 1228 Last data filed at 05/02/2022 0600 Gross per 24 hour  Intake 240 ml  Output --  Net 240 ml   Filed Weights   04/30/22 2016  Weight: 76.8 kg    Examination:  General exam: Appears comfortable, not in any acute distress.  Deconditioned. Respiratory system: CTA bilaterally, respiratory effort normal, RR 14. Cardiovascular system: S1 & S2 heard, regular rate and rhythm, no murmur. Gastrointestinal system: Abdomen is soft, non tender, non distended, BS+ Central nervous system: Alert and oriented x 2, no focal neurological deficits. Extremities: No edema, no cyanosis, no clubbing Skin: No rashes, lesions or ulcers Psychiatry: Judgement and insight appear normal. Mood & affect appropriate.     Data Reviewed: I have personally reviewed following labs and imaging studies  CBC: Recent Labs  Lab 04/30/22 1634 05/01/22 0557  WBC 7.0 5.9  NEUTROABS 5.0  --   HGB 12.6* 11.8*  HCT 38.7* 35.2*  MCV 96.0 94.1  PLT 107* [id number]*   Basic Metabolic Panel: Recent Labs  Lab 04/30/22 1634 05/01/22 0557  NA 137 137  K 4.0 3.5  CL 95* 102  CO2 31 28  GLUCOSE 106* 85  BUN 42* 41*  CREATININE 1.21 1.09  CALCIUM 9.2 8.7*   GFR: Estimated Creatinine Clearance: 37.5 mL/min (by C-G formula based on SCr of 1.09 mg/dL). Liver Function Tests: Recent Labs  Lab 04/30/22 1634  AST 24  ALT 17  ALKPHOS 75  BILITOT 0.7  PROT 6.7  ALBUMIN 3.5   No results for input(s): "LIPASE", "AMYLASE" in the last 168 hours. No results for input(s): "AMMONIA" in the last 168 hours. Coagulation Profile: Recent Labs  Lab 04/30/22 1634  INR 1.3*   Cardiac Enzymes: No results for input(s): "CKTOTAL", "CKMB",  "CKMBINDEX", "TROPONINI" in the last 168 hours. BNP (last 3 results) No results for input(s): "PROBNP" in the last 8760 hours. HbA1C: No results for input(s): "HGBA1C" in the last 72 hours. CBG: No results for input(s): "GLUCAP" in the last 168 hours. Lipid Profile: No results for input(s): "CHOL", "HDL", "LDLCALC", "TRIG", "CHOLHDL", "LDLDIRECT" in the last 72 hours. Thyroid Function Tests: No results for input(s): "TSH", "T4TOTAL", "FREET4", "T3FREE", "THYROIDAB" in the last 72 hours. Anemia Panel: No results for input(s): "VITAMINB12", "FOLATE", "FERRITIN", "TIBC", "IRON", "RETICCTPCT" in the last 72 hours. Sepsis Labs: Recent Labs  Lab 04/30/22 1634  LATICACIDVEN 1.1    Recent Results (from the past 240 hour(s))  Resp panel by RT-PCR (RSV, Flu A&B, Covid) Anterior Nasal Swab     Status: Abnormal   Collection Time: 04/30/22  4:34 PM   Specimen: Anterior Nasal Swab  Result Value Ref Range Status   SARS Coronavirus 2 by RT PCR POSITIVE (A) NEGATIVE Final    Comment: (  NOTE) SARS-CoV-2 target nucleic acids are DETECTED.  The SARS-CoV-2 RNA is generally detectable in upper respiratory specimens during the acute phase of infection. Positive results are indicative of the presence of the identified virus, but do not rule out bacterial infection or co-infection with other pathogens not detected by the test. Clinical correlation with patient history and other diagnostic information is necessary to determine patient infection status. The expected result is Negative.  Fact Sheet for Patients: EntrepreneurPulse.com.au  Fact Sheet for Healthcare Providers: IncredibleEmployment.be  This test is not yet approved or cleared by the [place] FDA and  has been authorized for detection and/or diagnosis of SARS-CoV-2 by FDA under an Emergency Use Authorization (EUA).  This EUA will remain in effect (meaning this test can be used) for the duration of   the COVID-19 declaration under Section 564(b)(1) of the A ct, 21 U.S.C. section 360bbb-3(b)(1), unless the authorization is terminated or revoked sooner.     Influenza A by PCR NEGATIVE NEGATIVE Final   Influenza B by PCR NEGATIVE NEGATIVE Final    Comment: (NOTE) The Xpert Xpress SARS-CoV-2/FLU/RSV plus assay is intended as an aid in the diagnosis of influenza from Nasopharyngeal swab specimens and should not be used as a sole basis for treatment. Nasal washings and aspirates are unacceptable for Xpert Xpress SARS-CoV-2/FLU/RSV testing.  Fact Sheet for Patients: EntrepreneurPulse.com.au  Fact Sheet for Healthcare Providers: IncredibleEmployment.be  This test is not yet approved or cleared by the [place] FDA and has been authorized for detection and/or diagnosis of SARS-CoV-2 by FDA under an Emergency Use Authorization (EUA). This EUA will remain in effect (meaning this test can be used) for the duration of the COVID-19 declaration under Section 564(b)(1) of the Act, 21 U.S.C. section 360bbb-3(b)(1), unless the authorization is terminated or revoked.     Resp Syncytial Virus by PCR NEGATIVE NEGATIVE Final    Comment: (NOTE) Fact Sheet for Patients: EntrepreneurPulse.com.au  Fact Sheet for Healthcare Providers: IncredibleEmployment.be  This test is not yet approved or cleared by the [place] FDA and has been authorized for detection and/or diagnosis of SARS-CoV-2 by FDA under an Emergency Use Authorization (EUA). This EUA will remain in effect (meaning this test can be used) for the duration of the COVID-19 declaration under Section 564(b)(1) of the Act, 21 U.S.C. section 360bbb-3(b)(1), unless the authorization is terminated or revoked.  Performed at [facility], [address] [place], [place] [address]   Urine Culture (for pregnant, neutropenic or urologic patients or  patients with an indwelling urinary catheter)     Status: Abnormal (Preliminary result)   Collection Time: 04/30/22  7:38 PM   Specimen: Urine, Clean Catch  Result Value Ref Range Status   Specimen Description   Final    URINE, CLEAN CATCH Performed at [facility], [address], [place], [place] [address]    Special Requests   Final    NONE Performed at [facility], [address], [place], [place] [address]    Culture >=100,000 COLONIES/mL ESCHERICHIA COLI (A)  Final   Report Status PENDING  Incomplete    Radiology Studies: DG Chest Port 1 View  Result Date: 04/30/2022 CLINICAL DATA:  Sepsis. EXAM: PORTABLE CHEST 1 VIEW COMPARISON:  None Available. FINDINGS: Mild cardiac enlargement. Aortic atherosclerotic calcifications noted. No signs of pleural effusion or edema. No airspace opacities. Coarsened interstitial markings are noted bilaterally. IMPRESSION: No acute cardiopulmonary abnormalities. Aortic Atherosclerosis (ICD10-I70.0). Electronically Signed   By: Queen Slough.D.  On: 04/30/2022 16:45    Scheduled Meds:  donepezil  10 mg Oral QHS   enoxaparin (LOVENOX) injection  40 mg Subcutaneous Q24H   finasteride  5 mg Oral Daily   furosemide  10 mg Oral Daily   hydrochlorothiazide  12.5 mg Oral Daily   losartan  100 mg Oral Daily   multivitamin with minerals  1 tablet Oral Daily   nirmatrelvir/ritonavir (renal dosing)  2 tablet Oral BID   sodium chloride flush  3 mL Intravenous Q12H   [START ON 05/06/2022] tamsulosin  0.4 mg Oral QPC supper   Continuous Infusions:  cefTRIAXone (ROCEPHIN)  IV 1 g (05/02/22 0937)     LOS: 1 day    Time spent: 35 mins    Mellanie Bejarano, MD [facility]   If 7PM-7AM, please contact night-coverage

## 2022-05-03 DIAGNOSIS — R531 Weakness: Secondary | ICD-10-CM | POA: Diagnosis not present

## 2022-05-03 DIAGNOSIS — N3000 Acute cystitis without hematuria: Secondary | ICD-10-CM | POA: Diagnosis not present

## 2022-05-03 DIAGNOSIS — U071 COVID-19: Secondary | ICD-10-CM | POA: Diagnosis not present

## 2022-05-03 LAB — URINE CULTURE: Culture: >=100000 — AB

## 2022-05-03 LAB — C-REACTIVE PROTEIN: CRP: 3.4 mg/dL — ABNORMAL HIGH (ref <1.0)

## 2022-05-03 MED ORDER — CEPHALEXIN 500 MG PO CAPS: 500.0000 mg | ORAL_CAPSULE | Freq: Three times a day (TID) | ORAL | 0 refills | Status: AC

## 2022-05-03 MED ORDER — NIRMATRELVIR/RITONAVIR (PAXLOVID) TABLET (RENAL DOSING): 2.0000 | ORAL_TABLET | Freq: Two times a day (BID) | ORAL | 0 refills | Status: AC

## 2022-05-03 NOTE — Discharge Summary (Signed)
Physician Discharge Summary   [name] MRN: [id number] DOB: September 11, 1922  Admit date:     04/30/2022  Discharge date: 05/03/22  Discharge Physician: [name]   PCP: [name], MD   Recommendations at discharge:   Follow-up with PCP in 1 week.  Discharge Diagnoses: Principal Problem:   Generalized weakness Active Problems:   UTI (urinary tract infection)   COVID-19 virus infection   Thrombocytopenia (HCC)   OSA on CPAP   Mild Alzheimer's dementia (HCC)   Benign essential hypertension   BPH (benign prostatic hyperplasia)   Atrial fibrillation, chronic (HCC) UTI secondary to E. coli. Resolved Problems:   * No resolved hospital problems. [facility] Course: This 89 yrs old Male with PMH significant for atrial fibrillation, hypertension, mild dementia presented in the ED with fever and generalized weakness. Patient reports he has been feeling increasingly tired for the last 2 to 3 days. He has been sleeping more often and reports lack of appetite. Patient also has developed mild URI symptoms including productive cough. Family reports positive COVID exposure as patient's wife's home health nurse tested + 5 days ago. Patient is not able to stand up on his own due to generalized weakness. He was febrile on arrival with a temp 100.7, SpO2 94% on room air. UA was positive for UTI. COVID PCR positive, Chest x-ray did not show any acute cardiopulmonary abnormalities. Patient is started on IV Rocephin for UTI and Paxlovid for COVID.  Condition had improved, patient was seen by PT/OT, no need for nursing placement.  Patient is medically stable to be discharged  Assessment and Plan: Generalized weakness: Secondary to UTI and COVID.  Patient has been seen by PT/OT, condition has improved.  Will set up home PT OT and RN.   Urinary tract infection: Urine culture came back with E. coli.  Continue antibiotics with Keflex for additional 7 days.   COVID-19 virus  infection: Currently no respiratory symptoms, no hypoxemia.  Patient will complete Paxlovid.   Thrombocytopenia: Likely secondary to COVID infection.   Condition seem to be improving.   OSA on CPAP Continue CPAP at bedtime.   Mild Alzheimer's dementia ([place]) Delirium precautions Continue home Aricept   Essential hypertension: Continue hydrochlorothiazide and Cozaar.   BPH (benign prostatic hyperplasia) Continue Flomax Bladder scans as noted above   Atrial fibrillation, chronic ([place]): Heart rate well-controlled. Follow-up with PCP.       Consultants: None Procedures performed: None  Disposition: Home health Diet recommendation:  Discharge Diet Orders (From admission, onward)     Start     Ordered   05/03/22 0000  Diet - low sodium heart healthy        05/03/22 1302           Cardiac diet DISCHARGE MEDICATION: Allergies as of 05/03/2022   No Known Allergies      Medication List     TAKE these medications    acetaminophen 500 MG tablet Commonly known as: TYLENOL Take 500 mg by mouth in the morning.   cephALEXin 500 MG capsule Commonly known as: KEFLEX Take 1 capsule (500 mg total) by mouth 3 (three) times daily for 7 days.   cyanocobalamin 100 MCG tablet Commonly known as: VITAMIN B12 Take 100 mcg by mouth daily.   donepezil 10 MG tablet Commonly known as: ARICEPT Take 10 mg by mouth at bedtime.   finasteride 5 MG tablet Commonly known as: PROSCAR Take 5 mg by mouth daily.   furosemide  20 MG tablet Commonly known as: LASIX Take 10 mg by mouth daily.   losartan-hydrochlorothiazide 100-12.5 MG tablet Commonly known as: HYZAAR Take 1 tablet by mouth daily.   multivitamin with minerals tablet Take 1 tablet by mouth daily.   nirmatrelvir/ritonavir (renal dosing) 10 x 150 MG & 10 x 100MG Tabs Commonly known as: PAXLOVID Take 2 tablets by mouth 2 (two) times daily for 5 days. Finish up the remaining dose   tamsulosin 0.4 MG Caps  capsule Commonly known as: FLOMAX Take 0.4 mg by mouth daily after supper.        Follow-up Information     [name], MD Follow up in 1 week(s).   Specialty: Internal Medicine Contact information: [address] [place] [address] [phone]                Discharge Exam: [name] Weights   04/30/22 2016  Weight: 76.8 kg   General exam: Appears calm and comfortable  Respiratory system: Clear to auscultation. Respiratory effort normal. Cardiovascular system:Irregular No JVD, murmurs, rubs, gallops or clicks. No pedal edema. Gastrointestinal system: Abdomen is nondistended, soft and nontender. No organomegaly or masses felt. Normal bowel sounds heard. Central nervous system: Alert and oriented. No focal neurological deficits. Extremities: Symmetric 5 x 5 power. Skin: No rashes, lesions or ulcers Psychiatry: Judgement and insight appear normal. Mood & affect appropriate.    Condition at discharge: good  The results of significant diagnostics from this hospitalization (including imaging, microbiology, ancillary and laboratory) are listed below for reference.   Imaging Studies: DG Chest Port 1 View  Result Date: 04/30/2022 CLINICAL DATA:  Sepsis. EXAM: PORTABLE CHEST 1 VIEW COMPARISON:  None Available. FINDINGS: Mild cardiac enlargement. Aortic atherosclerotic calcifications noted. No signs of pleural effusion or edema. No airspace opacities. Coarsened interstitial markings are noted bilaterally. IMPRESSION: No acute cardiopulmonary abnormalities. Aortic Atherosclerosis (ICD10-I70.0). Electronically Signed   By: Kerby Moors M.D.   On: 04/30/2022 16:45    Microbiology: Results for orders placed or performed during the hospital encounter of 04/30/22  Resp panel by RT-PCR (RSV, Flu A&B, Covid) Anterior Nasal Swab     Status: Abnormal   Collection Time: 04/30/22  4:34 PM   Specimen: Anterior Nasal Swab  Result Value Ref  Range Status   SARS Coronavirus 2 by RT PCR POSITIVE (A) NEGATIVE Final    Comment: (NOTE) SARS-CoV-2 target nucleic acids are DETECTED.  The SARS-CoV-2 RNA is generally detectable in upper respiratory specimens during the acute phase of infection. Positive results are indicative of the presence of the identified virus, but do not rule out bacterial infection or co-infection with other pathogens not detected by the test. Clinical correlation with patient history and other diagnostic information is necessary to determine patient infection status. The expected result is Negative.  Fact Sheet for Patients: EntrepreneurPulse.com.au  Fact Sheet for Healthcare Providers: IncredibleEmployment.be  This test is not yet approved or cleared by the [place] FDA and  has been authorized for detection and/or diagnosis of SARS-CoV-2 by FDA under an Emergency Use Authorization (EUA).  This EUA will remain in effect (meaning this test can be used) for the duration of  the COVID-19 declaration under Section 564(b)(1) of the A ct, 21 U.S.C. section 360bbb-3(b)(1), unless the authorization is terminated or revoked sooner.     Influenza A by PCR NEGATIVE NEGATIVE Final   Influenza B by PCR NEGATIVE NEGATIVE Final    Comment: (NOTE) The Xpert Xpress SARS-CoV-2/FLU/RSV  plus assay is intended as an aid in the diagnosis of influenza from Nasopharyngeal swab specimens and should not be used as a sole basis for treatment. Nasal washings and aspirates are unacceptable for Xpert Xpress SARS-CoV-2/FLU/RSV testing.  Fact Sheet for Patients: EntrepreneurPulse.com.au  Fact Sheet for Healthcare Providers: IncredibleEmployment.be  This test is not yet approved or cleared by the [place] FDA and has been authorized for detection and/or diagnosis of SARS-CoV-2 by FDA under an Emergency Use Authorization (EUA). This EUA will  remain in effect (meaning this test can be used) for the duration of the COVID-19 declaration under Section 564(b)(1) of the Act, 21 U.S.C. section 360bbb-3(b)(1), unless the authorization is terminated or revoked.     Resp Syncytial Virus by PCR NEGATIVE NEGATIVE Final    Comment: (NOTE) Fact Sheet for Patients: EntrepreneurPulse.com.au  Fact Sheet for Healthcare Providers: IncredibleEmployment.be  This test is not yet approved or cleared by the [place] FDA and has been authorized for detection and/or diagnosis of SARS-CoV-2 by FDA under an Emergency Use Authorization (EUA). This EUA will remain in effect (meaning this test can be used) for the duration of the COVID-19 declaration under Section 564(b)(1) of the Act, 21 U.S.C. section 360bbb-3(b)(1), unless the authorization is terminated or revoked.  Performed at [name], [address] [place], [place] [address]   Urine Culture (for pregnant, neutropenic or urologic patients or patients with an indwelling urinary catheter)     Status: Abnormal   Collection Time: 04/30/22  7:38 PM   Specimen: Urine, Clean Catch  Result Value Ref Range Status   Specimen Description   Final    URINE, CLEAN CATCH Performed at [facility] [address] [place], [place] [address]    Special Requests   Final    NONE Performed at [facility], [address] [place],  [address]    Culture >=100,000 COLONIES/mL ESCHERICHIA COLI (A)  Final   Report Status 05/03/2022 FINAL  Final   Organism ID, Bacteria ESCHERICHIA COLI (A)  Final      Susceptibility   Escherichia coli - MIC*    AMPICILLIN <=2 SENSITIVE Sensitive     CEFAZOLIN <=4 SENSITIVE Sensitive     CEFEPIME <=0.12 SENSITIVE Sensitive     CEFTRIAXONE <=0.25 SENSITIVE Sensitive     CIPROFLOXACIN >=4 RESISTANT Resistant     GENTAMICIN <=1 SENSITIVE Sensitive     IMIPENEM <=0.25 SENSITIVE Sensitive      NITROFURANTOIN <=16 SENSITIVE Sensitive     TRIMETH/SULFA <=20 SENSITIVE Sensitive     AMPICILLIN/SULBACTAM <=2 SENSITIVE Sensitive     PIP/TAZO <=4 SENSITIVE Sensitive     * >=100,000 COLONIES/mL ESCHERICHIA COLI    Labs: CBC: Recent Labs  Lab 04/30/22 1634 05/01/22 0557  WBC 7.0 5.9  NEUTROABS 5.0  --   HGB 12.6* 11.8*  HCT 38.7* 35.2*  MCV 96.0 94.1  PLT 107* 123XX123*   Basic Metabolic Panel: Recent Labs  Lab 04/30/22 1634 05/01/22 0557  NA 137 137  K 4.0 3.5  CL 95* 102  CO2 31 28  GLUCOSE 106* 85  BUN 42* 41*  CREATININE 1.21 1.09  CALCIUM 9.2 8.7*   Liver Function Tests: Recent Labs  Lab 04/30/22 1634  AST 24  ALT 17  ALKPHOS 75  BILITOT 0.7  PROT 6.7  ALBUMIN 3.5   CBG: No results for input(s): "GLUCAP" in the last 168 hours.  Discharge time spent: greater than 30 minutes.  Signed: [name], MD [facility] 05/03/2022

## 2022-05-03 NOTE — Progress Notes (Signed)
Patient cannot take patient home today, there is no caretaker at home.  Patient wife is in the emergency room.  Not a safe for patient to go home alone.  TOC will work on the plan, continue keeping in the hospital for now.

## 2022-05-03 NOTE — Progress Notes (Signed)
Occupational Therapy Treatment Patient Details Name: Robert [name] MRN: [id number] DOB: [birth year] Today's Date: 05/03/2022   History of present illness 89 y.o. male with medical history significant of atrial fibrillation, hypertension, mild dementia, who presents to the ED due to fever and weakness.  Pt found to be Covid+.   OT comments  Pt received semi-reclined in bed; asking OT for assistance opening his milk container, then pt drank the milk. Appearing alert; see below for cognitive deficits noted today; willing to work with OT on grooming and mobility. T/f MOD (I) to standing; stood at sink for grooming; RW supervision in room. See flowsheet below for further details of session. Left seated in recliner, chair alarm on, with all needs in reach.  Patient will benefit from continued OT while in acute care.    Recommendations for follow up therapy are one component of a multi-disciplinary discharge planning process, led by the attending physician.  Recommendations may be updated based on patient status, additional functional criteria and insurance authorization.    Follow Up Recommendations  Home health OT     Assistance Recommended at Discharge Intermittent Supervision/Assistance  Patient can return home with the following  Assistance with cooking/housework;Direct supervision/assist for medications management;Direct supervision/assist for financial management;Assist for transportation;Help with stairs or ramp for entrance   Equipment Recommendations  Tub/shower seat (use the RW he already has at home)    Recommendations for Other Services      Precautions / Restrictions Precautions Precautions: Fall Precaution Comments: Isolation- Covid+ Restrictions Weight Bearing Restrictions: No       Mobility Bed Mobility Overal bed mobility: Independent                  Transfers Overall transfer level: Modified independent Equipment used: Rolling walker (2 wheels)                      Balance                                           ADL either performed or assessed with clinical judgement   ADL Overall ADL's : Needs assistance/impaired Eating/Feeding: Independent;Bed level   Grooming: Oral care;Supervision/safety;Standing;Set up Grooming Details (indicate cue type and reason): Standing at sink; needed assistance from OT to open toothpaste container.         Upper Body Dressing : Set up;Sitting Upper Body Dressing Details (indicate cue type and reason): donned slip on tennis shoes at EOB                   General ADL Comments: Appears close to baseline.    Extremity/Trunk Assessment Upper Extremity Assessment Upper Extremity Assessment: Overall WFL for tasks assessed (Pt with some [place] deficits; appears to be baseline)   Lower Extremity Assessment Lower Extremity Assessment: Defer to PT evaluation;Overall WFL for tasks assessed        Vision       Perception     Praxis      Cognition Arousal/Alertness: Awake/alert Behavior During Therapy: WFL for tasks assessed/performed Overall Cognitive Status: History of cognitive impairments - at baseline                                 General Comments: Pt is alert; wearing hearing aids. Tells OT he knows he's in  the hospital; asks OT which one. Asks OT how long he's been here; says that there is a care home next to his house and he may have come from there. Pt unable to provide information about how many hours per day the aides come to his home. Pt thinks his family may have the same virus he has; is unable to state the name of the virus. Able to recall with difficulty and extra time what he typically eats for breakfast at home. Pt repeating self several times during session on some details (such as the fact that he has an electric toothbrush at home).        Exercises      Shoulder Instructions       General Comments Pt walked approx 150  feet in room with RW with supervision, no loss of balance. No cough or shortness of breath noted. Pt on room air.    Pertinent Vitals/ Pain       Pain Assessment Pain Assessment: No/denies pain  Home Living                                          Prior Functioning/Environment              Frequency  Min 2X/week        Progress Toward Goals  OT Goals(current goals can now be found in the care plan section)  Progress towards OT goals: Progressing toward goals  Acute Rehab OT Goals Patient Stated Goal: Get home OT Goal Formulation: With patient Time For Goal Achievement: 05/15/22 Potential to Achieve Goals: Good ADL Goals Pt Will Perform Grooming: with modified independence;standing Pt Will Perform Lower Body Dressing: sit to/from stand;with modified independence Pt Will Perform Toileting - Clothing Manipulation and hygiene: with modified independence;sit to/from stand  Plan Discharge plan remains appropriate    Co-evaluation                 AM-PAC OT "6 Clicks" Daily Activity     Outcome Measure   Help from another person eating meals?: None Help from another person taking care of personal grooming?: None Help from another person toileting, which includes using toliet, bedpan, or urinal?: None Help from another person bathing (including washing, rinsing, drying)?: None Help from another person to put on and taking off regular upper body clothing?: None Help from another person to put on and taking off regular lower body clothing?: A Little 6 Click Score: 23    End of Session Equipment Utilized During Treatment: Rolling walker (2 wheels)  OT Visit Diagnosis: Muscle weakness (generalized) (M62.81)   Activity Tolerance Patient tolerated treatment well   Patient Left in chair;with chair alarm set   Nurse Communication Mobility status        Time: [id number] OT Time Calculation (min): 28 min  Charges: OT General Charges $OT  Visit: 1 Visit OT Treatments $Self Care/Home Management : 8-22 mins $Therapeutic Activity: 8-22 mins  Waymon Amato, MS, OTR/L   Vania Rea 05/03/2022, 12:55 PM

## 2022-05-04 DIAGNOSIS — R531 Weakness: Secondary | ICD-10-CM | POA: Diagnosis not present

## 2022-05-04 DIAGNOSIS — N3000 Acute cystitis without hematuria: Secondary | ICD-10-CM | POA: Diagnosis not present

## 2022-05-04 DIAGNOSIS — U071 COVID-19: Secondary | ICD-10-CM | POA: Diagnosis not present

## 2022-05-04 LAB — CBC
HCT: 41 % (ref 39.0–52.0)
Hemoglobin: 13.5 g/dL (ref 13.0–17.0)
MCH: 30.8 pg (ref 26.0–34.0)
MCHC: 32.9 g/dL (ref 30.0–36.0)
MCV: 93.6 fL (ref 80.0–100.0)
Platelets: 94 10*3/uL — ABNORMAL LOW (ref 150–400)
RBC: 4.38 MIL/uL (ref 4.22–5.81)
RDW: 12.8 % (ref 11.5–15.5)
WBC: 5.4 10*3/uL (ref 4.0–10.5)
nRBC: 0 % (ref 0.0–0.2)

## 2022-05-04 LAB — BASIC METABOLIC PANEL
Anion gap: 8 (ref 5–15)
BUN: 36 mg/dL — ABNORMAL HIGH (ref 8–23)
CO2: 30 mmol/L (ref 22–32)
Calcium: 9 mg/dL (ref 8.9–10.3)
Chloride: 99 mmol/L (ref 98–111)
Creatinine, Ser: 1.05 mg/dL (ref 0.61–1.24)
GFR, Estimated: >60 mL/min (ref >60)
Glucose, Bld: 94 mg/dL (ref 70–99)
Potassium: 3.8 mmol/L (ref 3.5–5.1)
Sodium: 137 mmol/L (ref 135–145)

## 2022-05-04 LAB — MAGNESIUM: Magnesium: 2.4 mg/dL (ref 1.7–2.4)

## 2022-05-04 MED ORDER — CEPHALEXIN 500 MG PO CAPS
500.0000 mg | ORAL_CAPSULE | Freq: Three times a day (TID) | ORAL | Status: DC
  Administered 2022-05-04: 500 mg via ORAL
  Filled 2022-05-04: qty 1

## 2022-05-04 NOTE — TOC Transition Note (Signed)
Transition of Care [facility]) - CM/SW Discharge Note   Patient Details  Name: Robert DOCHERTY MRN: [id number] Date of Birth: 08/08/1922  [facility] CM/SW Contact:  Gerilyn Pilgrim, LCSW Phone Number: 05/04/2022, 10:38 AM   Clinical Narrative:   Pt has orders to discharge. Pt has sister who will come In to assist at discharge along with [facility]. CSW signing off.     Final next level of care: [place] Barriers to Discharge: Barriers Resolved   Patient Goals and CMS Choice      Discharge Placement                  Patient to be transferred to facility by: family Name of family member notified: daughtetr Patient and family notified of of transfer: 05/04/22  Discharge Plan and Services Additional resources added to the After Visit Summary for                                    Representative spoke with at [place]: cheryl  Social Determinants of Health ([place]) Interventions SDOH Screenings   Food Insecurity: No Food Insecurity (04/30/2022)  Housing: Low Risk  (04/30/2022)  Transportation Needs: No Transportation Needs (04/30/2022)  Utilities: Not At Risk (05/01/2022)  Tobacco Use: High Risk (04/30/2022)     Readmission Risk Interventions     No data to display

## 2022-05-04 NOTE — Care Management Important Message (Signed)
Important Message  Patient Details  Name: Robert [name] MRN: [id number] Date of Birth: April 06, 1922   Medicare Important Message Given:  N/A - LOS <3 / Initial given by admissions     Juliann Pulse A Kojo Liby 05/04/2022, 7:29 AM

## 2022-05-04 NOTE — Discharge Summary (Signed)
Physician Discharge Summary   [name] MRN: [id number] DOB: 1922/06/06  Admit date:     04/30/2022  Discharge date: 05/04/22  Discharge Physician: [name]   PCP: [name], MD   Recommendations at discharge:   Follow-up with PCP in 1 week.  Discharge Diagnoses: Principal Problem:   Generalized weakness Active Problems:   UTI (urinary tract infection)   COVID-19 virus infection   Thrombocytopenia (HCC)   OSA on CPAP   Mild Alzheimer's dementia (HCC)   Benign essential hypertension   BPH (benign prostatic hyperplasia)   Atrial fibrillation, chronic (HCC)  Resolved Problems:   * No resolved hospital problems. [facility] Course:  This 89 yrs old Male with PMH significant for atrial fibrillation, hypertension, mild dementia presented in the ED with fever and generalized weakness. Patient reports he has been feeling increasingly tired for the last 2 to 3 days. He has been sleeping more often and reports lack of appetite. Patient also has developed mild URI symptoms including productive cough. Family reports positive COVID exposure as patient's wife's home health nurse tested + 5 days ago. Patient is not able to stand up on his own due to generalized weakness. He was febrile on arrival with a temp 100.7, SpO2 94% on room air. UA was positive for UTI. COVID PCR positive, Chest x-ray did not show any acute cardiopulmonary abnormalities. Patient is started on IV Rocephin for UTI and Paxlovid for COVID.  Condition had improved, patient was seen by PT/OT, no need for nursing placement.  Patient is medically stable to be dischargedAssessment and Plan:  Assessment and Plan: Generalized weakness: Secondary to UTI and COVID.  Patient has been seen by PT/OT, condition has improved.  Will set up home PT OT and RN.   Urinary tract infection: Urine culture came back with E. coli.  Continue antibiotics with Keflex for additional 7 days.   COVID-19 virus  infection: Currently no respiratory symptoms, no hypoxemia.  Patient will complete Paxlovid.   Thrombocytopenia: Likely secondary to COVID infection.   Condition seem to be improving.   OSA on CPAP Continue CPAP at bedtime.   Mild Alzheimer's dementia (Robert [name]) Delirium precautions Continue home Aricept   Essential hypertension: Continue hydrochlorothiazide and Cozaar.   BPH (benign prostatic hyperplasia) Continue Flomax Bladder scans as noted above   Atrial fibrillation, chronic (Robert [name]): Heart rate well-controlled. Follow-up with PCP.  Patient family cannot take care of him at home yesterday.  He is still stable to be discharged.      Consultants: None Procedures performed: None  Disposition: Home health Diet recommendation:  Discharge Diet Orders (From admission, onward)     Start     Ordered   05/03/22 0000  Diet - low sodium heart healthy        05/03/22 1302           Cardiac diet DISCHARGE MEDICATION: Allergies as of 05/04/2022   No Known Allergies      Medication List     TAKE these medications    acetaminophen 500 MG tablet Commonly known as: TYLENOL Take 500 mg by mouth in the morning.   cephALEXin 500 MG capsule Commonly known as: KEFLEX Take 1 capsule (500 mg total) by mouth 3 (three) times daily for 7 days.   cyanocobalamin 100 MCG tablet Commonly known as: VITAMIN B12 Take 100 mcg by mouth daily.   donepezil 10 MG tablet Commonly known as: ARICEPT Take 10 mg by mouth at bedtime.  finasteride 5 MG tablet Commonly known as: PROSCAR Take 5 mg by mouth daily.   furosemide 20 MG tablet Commonly known as: LASIX Take 10 mg by mouth daily.   losartan-hydrochlorothiazide 100-12.5 MG tablet Commonly known as: HYZAAR Take 1 tablet by mouth daily.   multivitamin with minerals tablet Take 1 tablet by mouth daily.   nirmatrelvir/ritonavir (renal dosing) 10 x 150 MG & 10 x 100MG Tabs Commonly known as: PAXLOVID Take 2 tablets by mouth  2 (two) times daily for 5 days. Finish up the remaining dose   tamsulosin 0.4 MG Caps capsule Commonly known as: FLOMAX Take 0.4 mg by mouth daily after supper.        Follow-up Information     [name], MD Follow up in 1 week(s).   Specialty: Internal Medicine Contact information: [address] [place] [address] [address] [phone]                Discharge Exam: [name] Weights   04/30/22 2016  Weight: 76.8 kg   General exam: Appears calm and comfortable  Respiratory system: Clear to auscultation. Respiratory effort normal. Cardiovascular system: Irregular. No JVD, murmurs, rubs, gallops or clicks. No pedal edema. Gastrointestinal system: Abdomen is nondistended, soft and nontender. No organomegaly or masses felt. Normal bowel sounds heard. Central nervous system: Alert and oriented x3. No focal neurological deficits. Extremities: Symmetric 5 x 5 power. Skin: No rashes, lesions or ulcers Psychiatry: Judgement and insight appear normal. Mood & affect appropriate.    Condition at discharge: good  The results of significant diagnostics from this hospitalization (including imaging, microbiology, ancillary and laboratory) are listed below for reference.   Imaging Studies: DG Chest Port 1 View  Result Date: 04/30/2022 CLINICAL DATA:  Sepsis. EXAM: PORTABLE CHEST 1 VIEW COMPARISON:  None Available. FINDINGS: Mild cardiac enlargement. Aortic atherosclerotic calcifications noted. No signs of pleural effusion or edema. No airspace opacities. Coarsened interstitial markings are noted bilaterally. IMPRESSION: No acute cardiopulmonary abnormalities. Aortic Atherosclerosis (ICD10-I70.0). Electronically Signed   By: Kerby Moors M.D.   On: 04/30/2022 16:45    Microbiology: Results for orders placed or performed during the hospital encounter of 04/30/22  Resp panel by RT-PCR (RSV, Flu A&B, Covid) Anterior Nasal Swab     Status:  Abnormal   Collection Time: 04/30/22  4:34 PM   Specimen: Anterior Nasal Swab  Result Value Ref Range Status   SARS Coronavirus 2 by RT PCR POSITIVE (A) NEGATIVE Final    Comment: (NOTE) SARS-CoV-2 target nucleic acids are DETECTED.  The SARS-CoV-2 RNA is generally detectable in upper respiratory specimens during the acute phase of infection. Positive results are indicative of the presence of the identified virus, but do not rule out bacterial infection or co-infection with other pathogens not detected by the test. Clinical correlation with patient history and other diagnostic information is necessary to determine patient infection status. The expected result is Negative.  Fact Sheet for Patients: EntrepreneurPulse.com.au  Fact Sheet for Healthcare Providers: IncredibleEmployment.be  This test is not yet approved or cleared by the [place] FDA and  has been authorized for detection and/or diagnosis of SARS-CoV-2 by FDA under an Emergency Use Authorization (EUA).  This EUA will remain in effect (meaning this test can be used) for the duration of  the COVID-19 declaration under Section 564(b)(1) of the A ct, 21 U.S.C. section 360bbb-3(b)(1), unless the authorization is terminated or revoked sooner.     Influenza A by PCR NEGATIVE NEGATIVE  Final   Influenza B by PCR NEGATIVE NEGATIVE Final    Comment: (NOTE) The Xpert Xpress SARS-CoV-2/FLU/RSV plus assay is intended as an aid in the diagnosis of influenza from Nasopharyngeal swab specimens and should not be used as a sole basis for treatment. Nasal washings and aspirates are unacceptable for Xpert Xpress SARS-CoV-2/FLU/RSV testing.  Fact Sheet for Patients: EntrepreneurPulse.com.au  Fact Sheet for Healthcare Providers: IncredibleEmployment.be  This test is not yet approved or cleared by the [place] FDA and has been authorized for detection  and/or diagnosis of SARS-CoV-2 by FDA under an Emergency Use Authorization (EUA). This EUA will remain in effect (meaning this test can be used) for the duration of the COVID-19 declaration under Section 564(b)(1) of the Act, 21 U.S.C. section 360bbb-3(b)(1), unless the authorization is terminated or revoked.     Resp Syncytial Virus by PCR NEGATIVE NEGATIVE Final    Comment: (NOTE) Fact Sheet for Patients: EntrepreneurPulse.com.au  Fact Sheet for Healthcare Providers: IncredibleEmployment.be  This test is not yet approved or cleared by the [place] FDA and has been authorized for detection and/or diagnosis of SARS-CoV-2 by FDA under an Emergency Use Authorization (EUA). This EUA will remain in effect (meaning this test can be used) for the duration of the COVID-19 declaration under Section 564(b)(1) of the Act, 21 U.S.C. section 360bbb-3(b)(1), unless the authorization is terminated or revoked.  Performed at [facility] [address] [place], [place] [address]   Urine Culture (for pregnant, neutropenic or urologic patients or patients with an indwelling urinary catheter)     Status: Abnormal   Collection Time: 04/30/22  7:38 PM   Specimen: Urine, Clean Catch  Result Value Ref Range Status   Specimen Description   Final    URINE, CLEAN CATCH Performed at [facility], [address] [place], [place] [address]    Special Requests   Final    NONE Performed at [facility], [address] [place], [place] [address]    Culture >=100,000 COLONIES/mL ESCHERICHIA COLI (A)  Final   Report Status 05/03/2022 FINAL  Final   Organism ID, Bacteria ESCHERICHIA COLI (A)  Final      Susceptibility   Escherichia coli - MIC*    AMPICILLIN <=2 SENSITIVE Sensitive     CEFAZOLIN <=4 SENSITIVE Sensitive     CEFEPIME <=0.12 SENSITIVE Sensitive     CEFTRIAXONE <=0.25 SENSITIVE Sensitive     CIPROFLOXACIN >=4 RESISTANT  Resistant     GENTAMICIN <=1 SENSITIVE Sensitive     IMIPENEM <=0.25 SENSITIVE Sensitive     NITROFURANTOIN <=16 SENSITIVE Sensitive     TRIMETH/SULFA <=20 SENSITIVE Sensitive     AMPICILLIN/SULBACTAM <=2 SENSITIVE Sensitive     PIP/TAZO <=4 SENSITIVE Sensitive     * >=100,000 COLONIES/mL ESCHERICHIA COLI    Labs: CBC: Recent Labs  Lab 04/30/22 1634 05/01/22 0557 05/04/22 0515  WBC 7.0 5.9 5.4  NEUTROABS 5.0  --   --   HGB 12.6* 11.8* 13.5  HCT 38.7* 35.2* 41.0  MCV 96.0 94.1 93.6  PLT 107* 100* 94*   Basic Metabolic Panel: Recent Labs  Lab 04/30/22 1634 05/01/22 0557 05/04/22 0515  NA 137 137 137  K 4.0 3.5 3.8  CL 95* 102 99  CO2 31 28 30  $ GLUCOSE 106* 85 94  BUN 42* 41* 36*  CREATININE 1.21 1.09 1.05  CALCIUM 9.2 8.7* 9.0  MG  --   --  2.4   Liver Function Tests: Recent Labs  Lab 04/30/22 1634  AST 24  ALT 17  ALKPHOS 75  BILITOT 0.7  PROT 6.7  ALBUMIN 3.5   CBG: No results for input(s): "GLUCAP" in the last 168 hours.  Discharge time spent: less than 30 minutes.  Signed: [name], MD [facility] 05/04/2022

## 2022-06-07 ENCOUNTER — Emergency Department: Payer: Medicare HMO

## 2022-06-07 ENCOUNTER — Emergency Department
Admission: EM | Admit: 2022-06-07 | Discharge: 2022-06-07 | Disposition: A | Discharge status: Home / Self Care | Payer: Medicare HMO | Attending: Emergency Medicine | Admitting: Emergency Medicine

## 2022-06-07 ENCOUNTER — Other Ambulatory Visit: Payer: Self-pay

## 2022-06-07 DIAGNOSIS — S51011A Laceration without foreign body of right elbow, initial encounter: Secondary | ICD-10-CM | POA: Diagnosis not present

## 2022-06-07 DIAGNOSIS — W228XXA Striking against or struck by other objects, initial encounter: Secondary | ICD-10-CM | POA: Insufficient documentation

## 2022-06-07 DIAGNOSIS — N390 Urinary tract infection, site not specified: Secondary | ICD-10-CM | POA: Insufficient documentation

## 2022-06-07 DIAGNOSIS — R55 Syncope and collapse: Secondary | ICD-10-CM | POA: Insufficient documentation

## 2022-06-07 DIAGNOSIS — Y92009 Unspecified place in unspecified non-institutional (private) residence as the place of occurrence of the external cause: Secondary | ICD-10-CM | POA: Insufficient documentation

## 2022-06-07 DIAGNOSIS — D72829 Elevated white blood cell count, unspecified: Secondary | ICD-10-CM | POA: Insufficient documentation

## 2022-06-07 DIAGNOSIS — Y9389 Activity, other specified: Secondary | ICD-10-CM | POA: Insufficient documentation

## 2022-06-07 LAB — BASIC METABOLIC PANEL
Anion gap: 3 — ABNORMAL LOW (ref 5–15)
BUN: 49 mg/dL — ABNORMAL HIGH (ref 8–23)
CO2: 33 mmol/L — ABNORMAL HIGH (ref 22–32)
Calcium: 9.2 mg/dL (ref 8.9–10.3)
Chloride: 101 mmol/L (ref 98–111)
Creatinine, Ser: 1.05 mg/dL (ref 0.61–1.24)
GFR, Estimated: >60 mL/min (ref >60)
Glucose, Bld: 126 mg/dL — ABNORMAL HIGH (ref 70–99)
Potassium: 4.2 mmol/L (ref 3.5–5.1)
Sodium: 137 mmol/L (ref 135–145)

## 2022-06-07 LAB — URINALYSIS, ROUTINE W REFLEX MICROSCOPIC
Bilirubin Urine: NEGATIVE
Glucose, UA: NEGATIVE mg/dL
Hgb urine dipstick: NEGATIVE
Ketones, ur: NEGATIVE mg/dL
Nitrite: NEGATIVE
Protein, ur: 30 mg/dL — AB
Specific Gravity, Urine: 1.017 (ref 1.005–1.030)
Squamous Epithelial / HPF: NONE SEEN /HPF (ref 0–5)
WBC, UA: >50 WBC/hpf (ref 0–5)
pH: 6 (ref 5.0–8.0)

## 2022-06-07 LAB — CBC
HCT: 35.5 % — ABNORMAL LOW (ref 39.0–52.0)
Hemoglobin: 11.6 g/dL — ABNORMAL LOW (ref 13.0–17.0)
MCH: 31.4 pg (ref 26.0–34.0)
MCHC: 32.7 g/dL (ref 30.0–36.0)
MCV: 96.2 fL (ref 80.0–100.0)
Platelets: 135 10*3/uL — ABNORMAL LOW (ref 150–400)
RBC: 3.69 MIL/uL — ABNORMAL LOW (ref 4.22–5.81)
RDW: 14 % (ref 11.5–15.5)
WBC: 7.1 10*3/uL (ref 4.0–10.5)
nRBC: 0 % (ref 0.0–0.2)

## 2022-06-07 LAB — TROPONIN I (HIGH SENSITIVITY)
Troponin I (High Sensitivity): 17 ng/L (ref <18)
Troponin I (High Sensitivity): 19 ng/L — ABNORMAL HIGH (ref <18)

## 2022-06-07 MED ORDER — CEFDINIR 300 MG PO CAPS: 300.0000 mg | ORAL_CAPSULE | Freq: Two times a day (BID) | ORAL | 0 refills | Status: AC

## 2022-06-07 MED ORDER — CEFDINIR 300 MG PO CAPS
300.0000 mg | ORAL_CAPSULE | Freq: Once | ORAL | Status: DC
  Filled 2022-06-07: qty 1

## 2022-06-07 NOTE — ED Triage Notes (Addendum)
Pt to ED via [facility] from home. Pt was doing at home exercises with caregiver and had syncopal episode. Pt hit his head on the door. Pt has abrasions on right elbow. Pt denies blood thinners. Pt has hx afib with rate controlled 70-80s. 16g LAC. CBG 151.

## 2022-06-07 NOTE — ED Provider Notes (Signed)
[facility] Provider Note   Event Date/Time   First MD Initiated Contact with Patient 06/07/22 1716     (approximate) History  Loss of Consciousness  HPI Robert [name] is a 89 y.o. male with a past medical history of chronic atrial fibrillation who presents via EMS after syncopal event while "doing laps" around the anterior of his home with physical therapy today.  Patient states that he was in his normal state of health until he woke up on the floor.  Patient states that this has happened to him previously but still wanted to come to the emergency department to get checked out.  Bystanders noted patient to have head trauma however patient not complaining of any headache or palpable pain in his head.  Patient does have a skin tear to the right elbow.  Patient denies blood thinner use.  Patient denies any other pain at this time. ROS: Patient currently denies any vision changes, tinnitus, difficulty speaking, facial droop, sore throat, chest pain, shortness of breath, abdominal pain, nausea/vomiting/diarrhea, dysuria, or weakness/numbness/paresthesias in any extremity   Physical Exam  Triage Vital Signs: ED Triage Vitals [06/07/22 1714]  Enc Vitals Group     BP      Pulse      Resp      Temp      Temp src      SpO2      Weight      Height      Head Circumference      Peak Flow      Pain Score 0     Pain Loc      Pain Edu?      Excl. in [place]?    Most recent vital signs: Vitals:   06/07/22 1816 06/07/22 1940  BP:  (!) 165/87  Pulse: 75 82  Resp: 16 (!) 24  Temp:    SpO2: 97% 98%   General: Awake, oriented x4. CV:  Good peripheral perfusion.  Resp:  Normal effort.  Abd:  No distention.  Other:  Elderly overweight Caucasian male laying in bed in no acute distress.  Skin tear to lateral right elbow ED Results / Procedures / Treatments  Labs (all labs ordered are listed, but only abnormal results are displayed) Labs Reviewed  BASIC METABOLIC  PANEL - Abnormal; Notable for the following components:      Result Value   CO2 33 (*)    Glucose, Bld 126 (*)    BUN 49 (*)    Anion gap 3 (*)    All other components within normal limits  CBC - Abnormal; Notable for the following components:   RBC 3.69 (*)    Hemoglobin 11.6 (*)    HCT 35.5 (*)    Platelets 135 (*)    All other components within normal limits  URINALYSIS, ROUTINE W REFLEX MICROSCOPIC - Abnormal; Notable for the following components:   Color, Urine YELLOW (*)    APPearance CLOUDY (*)    Protein, ur 30 (*)    Leukocytes,Ua LARGE (*)    Bacteria, UA RARE (*)    All other components within normal limits  TROPONIN I (HIGH SENSITIVITY) - Abnormal; Notable for the following components:   Troponin I (High Sensitivity) 19 (*)    All other components within normal limits  TROPONIN I (HIGH SENSITIVITY)   EKG ED ECG REPORT I, Naaman Plummer, the attending physician, personally viewed and interpreted this ECG. Date: 06/07/2022 EKG Time: 1719  Rate: 83 Rhythm: Atrial flutter QRS Axis: normal Intervals: normal ST/T Wave abnormalities: normal Narrative Interpretation: Atrial flutter.  No evidence of acute ischemia RADIOLOGY ED MD interpretation: One-view portable chest x-ray interpreted by me shows no evidence of acute abnormalities including no pneumonia, pneumothorax, or widened mediastinum there is chronic cardiomegaly with central pulmonary vascular congestion consistent with CHF  CT of the head without contrast interpreted by me shows no evidence of acute abnormalities including no intracerebral hemorrhage, obvious masses, or significant edema  CT of the cervical spine interpreted by me does not show any evidence of acute abnormalities including no acute fracture, malalignment, height loss, or dislocation -Agree with radiology assessment Official radiology report(s): DG Chest Port 1 View  Result Date: 06/07/2022 CLINICAL DATA:  Syncope EXAM: PORTABLE CHEST 1 VIEW  COMPARISON:  Chest x-ray 04/30/2022 FINDINGS: The heart is enlarged. There central pulmonary vascular congestion. There are interstitial opacities in the lung bases. Costophrenic angles are clear. No pneumothorax or acute fracture. IMPRESSION: 1. Cardiomegaly with central pulmonary vascular congestion. 2. Interstitial opacities in the lung bases may represent edema or infection. Electronically Signed   By: Ronney Asters M.D.   On: 06/07/2022 17:50   CT Cervical Spine Wo Contrast  Result Date: 06/07/2022 CLINICAL DATA:  Neck trauma.  Syncopal episode.  Head injury. EXAM: CT CERVICAL SPINE WITHOUT CONTRAST TECHNIQUE: Multidetector CT imaging of the cervical spine was performed without intravenous contrast. Multiplanar CT image reconstructions were also generated. RADIATION DOSE REDUCTION: This exam was performed according to the departmental dose-optimization program which includes automated exposure control, adjustment of the mA and/or kV according to patient size and/or use of iterative reconstruction technique. COMPARISON:  None Available. FINDINGS: Despite efforts by the technologist and patient, mild motion artifact is present on today's exam and could not be eliminated. This reduces exam sensitivity and specificity. Images through the lower neck were repeated. Alignment: Straightening of the usual cervical lordosis without focal angulation. There is a mild degenerative anterolisthesis at C2-3. There is a mild upper thoracic scoliosis. Skull base and vertebrae: No evidence of acute cervical spine fracture or traumatic subluxation. There is multilevel cervical spondylosis. Soft tissues and spinal canal: No prevertebral fluid or swelling. No visible canal hematoma. Bilateral carotid atherosclerosis. Disc levels: No large disc herniation or high-grade spinal stenosis demonstrated. There is multilevel spondylosis with disc space narrowing, uncinate spurring and facet hypertrophy. Suspected interbody ankylosis at  C6-7 and T1-2. There is up to moderate osseous foraminal narrowing. Upper chest: Emphysematous changes at both lung apices. Aortic and great vessel atherosclerosis. Other: None. IMPRESSION: 1. No evidence of acute cervical spine fracture, traumatic subluxation or static signs of instability. 2. Multilevel cervical spondylosis as described. 3.  Aortic Atherosclerosis (ICD10-I70.0). Electronically Signed   By: Richardean Sale M.D.   On: 06/07/2022 17:42   CT Head Wo Contrast  Result Date: 06/07/2022 CLINICAL DATA:  Head trauma, minor (Age >= 65y) EXAM: CT HEAD WITHOUT CONTRAST TECHNIQUE: Contiguous axial images were obtained from the base of the skull through the vertex without intravenous contrast. RADIATION DOSE REDUCTION: This exam was performed according to the departmental dose-optimization program which includes automated exposure control, adjustment of the mA and/or kV according to patient size and/or use of iterative reconstruction technique. COMPARISON:  CT head 10/04/2019 FINDINGS: Brain: Patchy and confluent areas of decreased attenuation are noted throughout the deep and periventricular white matter of the cerebral hemispheres bilaterally, compatible with chronic microvascular ischemic disease. No evidence of large-territorial acute infarction. No parenchymal hemorrhage. No  mass lesion. No extra-axial collection. No mass effect or midline shift. No hydrocephalus. Basilar cisterns are patent. Vascular: No hyperdense vessel. Atherosclerotic calcifications are present within the cavernous internal carotid and vertebral arteries. Skull: No acute fracture or focal lesion. Sinuses/Orbits: Paranasal sinuses and mastoid air cells are clear. The orbits are unremarkable. Other: None. IMPRESSION: No acute intracranial abnormality. Electronically Signed   By: Iven Finn M.D.   On: 06/07/2022 17:38   PROCEDURES: Critical Care performed: No .1-3 Lead EKG Interpretation  Performed by: Naaman Plummer,  MD Authorized by: Naaman Plummer, MD     Interpretation: abnormal     ECG rate:  83   ECG rate assessment: normal     Rhythm: atrial fibrillation     Ectopy: none     Conduction: normal    MEDICATIONS ORDERED IN ED: Medications  cefdinir (OMNICEF) capsule 300 mg (has no administration in time range)   IMPRESSION / MDM / ASSESSMENT AND PLAN / ED COURSE  I reviewed the triage vital signs and the nursing notes.                             Patient's presentation is most consistent with acute presentation with potential threat to life or bodily function. Patient presents with complaints of syncope/presyncope ED Workup:  CBC, BMP, Troponin, BNP, ECG, CXR Differential diagnosis includes HF, ICH, seizure, stroke, HOCM, ACS, aortic dissection, malignant arrhythmia, or GI bleed. Findings: No evidence of acute laboratory abnormalities.  Troponin negative x1 EKG: No e/o STEMI. No evidence of Brugadas sign, delta wave, epsilon wave, significantly prolonged QTc, or malignant arrhythmia. Patient does show signs of persistent atrial fibrillation as well as UA with bacteria and leukocytosis concern for possible early urinary tract infection.  Will treat patient empirically with Rocephin and encouraged follow-up with primary care physician as well as cardiology for persistent atrial fibrillation if symptoms recur. Disposition: Discharge. Patient is at baseline at this time. Return precautions expressed and understood in person. Advised follow up with primary care provider or clinic physician in next 24 hours.   FINAL CLINICAL IMPRESSION(S) / ED DIAGNOSES   Final diagnoses:  Syncope and collapse  Urinary tract infection without hematuria, site unspecified  Skin tear of right elbow without complication, initial encounter   Rx / DC Orders   ED Discharge Orders          Ordered    cefdinir (OMNICEF) 300 MG capsule  2 times daily        06/07/22 2026           Note:  This document was  prepared using Dragon voice recognition software and may include unintentional dictation errors.   Naaman Plummer, MD 06/07/22 [phone]

## 2022-08-19 ENCOUNTER — Other Ambulatory Visit: Payer: Self-pay

## 2022-08-19 ENCOUNTER — Emergency Department: Payer: Medicare HMO

## 2022-08-19 ENCOUNTER — Emergency Department
Admission: EM | Admit: 2022-08-19 | Discharge: 2022-08-20 | Disposition: A | Discharge status: Home / Self Care | Payer: Medicare HMO | Attending: Emergency Medicine | Admitting: Emergency Medicine

## 2022-08-19 ENCOUNTER — Encounter: Payer: Self-pay | Admitting: Intensive Care

## 2022-08-19 DIAGNOSIS — F0390 Unspecified dementia without behavioral disturbance: Secondary | ICD-10-CM | POA: Insufficient documentation

## 2022-08-19 DIAGNOSIS — K8050 Calculus of bile duct without cholangitis or cholecystitis without obstruction: Secondary | ICD-10-CM | POA: Diagnosis not present

## 2022-08-19 DIAGNOSIS — I10 Essential (primary) hypertension: Secondary | ICD-10-CM | POA: Insufficient documentation

## 2022-08-19 DIAGNOSIS — I2510 Atherosclerotic heart disease of native coronary artery without angina pectoris: Secondary | ICD-10-CM | POA: Insufficient documentation

## 2022-08-19 DIAGNOSIS — N3000 Acute cystitis without hematuria: Secondary | ICD-10-CM | POA: Diagnosis present

## 2022-08-19 LAB — CBC WITH DIFFERENTIAL/PLATELET
Abs Immature Granulocytes: 0.03 10*3/uL (ref 0.00–0.07)
Basophils Absolute: 0 10*3/uL (ref 0.0–0.1)
Basophils Relative: 1 %
Eosinophils Absolute: 0.1 10*3/uL (ref 0.0–0.5)
Eosinophils Relative: 2 %
HCT: 39.4 % (ref 39.0–52.0)
Hemoglobin: 12.8 g/dL — ABNORMAL LOW (ref 13.0–17.0)
Immature Granulocytes: 1 %
Lymphocytes Relative: 17 %
Lymphs Abs: 1.1 10*3/uL (ref 0.7–4.0)
MCH: 31 pg (ref 26.0–34.0)
MCHC: 32.5 g/dL (ref 30.0–36.0)
MCV: 95.4 fL (ref 80.0–100.0)
Monocytes Absolute: 0.6 10*3/uL (ref 0.1–1.0)
Monocytes Relative: 9 %
Neutro Abs: 4.5 10*3/uL (ref 1.7–7.7)
Neutrophils Relative %: 70 %
Platelets: 139 10*3/uL — ABNORMAL LOW (ref 150–400)
RBC: 4.13 MIL/uL — ABNORMAL LOW (ref 4.22–5.81)
RDW: 13.5 % (ref 11.5–15.5)
WBC: 6.4 10*3/uL (ref 4.0–10.5)
nRBC: 0 % (ref 0.0–0.2)

## 2022-08-19 LAB — URINALYSIS, ROUTINE W REFLEX MICROSCOPIC
Bilirubin Urine: NEGATIVE
Glucose, UA: NEGATIVE mg/dL
Hgb urine dipstick: NEGATIVE
Ketones, ur: NEGATIVE mg/dL
Nitrite: POSITIVE — AB
Protein, ur: NEGATIVE mg/dL
Specific Gravity, Urine: 1.015 (ref 1.005–1.030)
WBC, UA: >50 WBC/hpf (ref 0–5)
pH: 6 (ref 5.0–8.0)

## 2022-08-19 LAB — COMPREHENSIVE METABOLIC PANEL
ALT: 16 U/L (ref 0–44)
AST: 21 U/L (ref 15–41)
Albumin: 3.6 g/dL (ref 3.5–5.0)
Alkaline Phosphatase: 81 U/L (ref 38–126)
Anion gap: 10 (ref 5–15)
BUN: 43 mg/dL — ABNORMAL HIGH (ref 8–23)
CO2: 28 mmol/L (ref 22–32)
Calcium: 9.3 mg/dL (ref 8.9–10.3)
Chloride: 101 mmol/L (ref 98–111)
Creatinine, Ser: 1.14 mg/dL (ref 0.61–1.24)
GFR, Estimated: 58 mL/min — ABNORMAL LOW (ref >60)
Glucose, Bld: 113 mg/dL — ABNORMAL HIGH (ref 70–99)
Potassium: 3.6 mmol/L (ref 3.5–5.1)
Sodium: 139 mmol/L (ref 135–145)
Total Bilirubin: 0.8 mg/dL (ref 0.3–1.2)
Total Protein: 6.8 g/dL (ref 6.5–8.1)

## 2022-08-19 LAB — LACTIC ACID, PLASMA: Lactic Acid, Venous: 1.5 mmol/L (ref 0.5–1.9)

## 2022-08-19 MED ORDER — IOHEXOL 300 MG/ML  SOLN
100.0000 mL | Freq: Once | INTRAMUSCULAR | Status: AC | PRN
  Administered 2022-08-19: 100 mL via INTRAVENOUS

## 2022-08-19 MED ORDER — PIPERACILLIN-TAZOBACTAM 3.375 G IVPB 30 MIN
3.3750 g | Freq: Once | INTRAVENOUS | Status: AC
  Administered 2022-08-19: 3.375 g via INTRAVENOUS
  Filled 2022-08-19: qty 50

## 2022-08-19 NOTE — ED Notes (Signed)
[facility] rep Selena Batten called in & stated spoke with medical director Kapadia regarding acceptance/denied patient due to high capacity.

## 2022-08-19 NOTE — ED Notes (Signed)
Called to [facility] Per MD Ray/rep:Robin.Marland Kitchen

## 2022-08-19 NOTE — ED Triage Notes (Addendum)
Patient seen by PCP starting 07/28/22 for UTI and has been on two rounds of antibiotics with no relief.   C/o having bloating in abdomen and trouble with PT today ambulating.   Oriented to person and place. Disoriented to time

## 2022-08-19 NOTE — ED Notes (Signed)
called to [facility] for transfer PER MD Ray / rep: Lollie Marrow stated that medicine & surgery are closed due to being over capacity.Marland KitchenMarland Kitchen

## 2022-08-19 NOTE — ED Notes (Signed)
Called to [facility] Per MD Ray for GI @ [facility].Marland Kitchen

## 2022-08-19 NOTE — ED Notes (Signed)
rep Merry Proud called in from [facility] regarding pt acceptance & stated no beds at this time/could possibly be on waitlist ..Marland KitchenMarland Kitchen

## 2022-08-19 NOTE — ED Provider Notes (Signed)
[facility] Provider Note    Event Date/Time   First MD Initiated Contact with Patient 08/19/22 1639     (approximate)   History   Recurrent UTI   HPI  Robert [name] is a 89 y.o. male  with history of HTN, dementia, AFib presenting to the ER with concerns for UTI.  Patient provide daughter provides lateral history.  She reports that about 5 days ago they noticed patient had some mild agitation and change in his gait.  She reports in the past patient has had UTIs.  She went to his primary care doctor where he had a urine test concerning for UTI and he was placed on ciprofloxacin.  He is been taking this for 5 days, but has not had any improvement.  His caretaker at home noticed that he had some abdominal distention and so patient was brought into the ER for further evaluation.  Patient is able to tell me he is in the hospital.  He denies any complaints.  Daughter does feel that he is behaving at his baseline and his mental status is consistent with his baseline dementia.      Physical Exam   Triage Vital Signs: ED Triage Vitals  Enc Vitals Group     BP 08/19/22 1430 (!) 141/54     Pulse Rate 08/19/22 1430 (!) 57     Resp 08/19/22 1430 18     Temp 08/19/22 1430 97.7 F (36.5 C)     Temp Source 08/19/22 1430 Oral     SpO2 08/19/22 1430 97 %     Weight 08/19/22 1431 130 lb (59 kg)     Height 08/19/22 1431 5\' 4"  (1.626 m)     Head Circumference --      Peak Flow --      Pain Score 08/19/22 1431 0     Pain Loc --      Pain Edu? --      Excl. in GC? --     Most recent vital signs: Vitals:   08/19/22 2133 08/19/22 2303  BP: (!) 143/62 138/73  Pulse: 92 (!) 56  Resp: 15   Temp:    SpO2: 95% 99%     General: Awake, interactive  CV:  Regular rate, good peripheral perfusion.  Resp:  Lungs clear, unlabored respirations.  Abd:  Soft, mild fullness without significant tenderness to palpation. Neuro:  Symmetric facial movement, fluid speech,  moving extremities spontaneously.  Some confusion about situation.  Not oriented to time.   ED Results / Procedures / Treatments   Labs (all labs ordered are listed, but only abnormal results are displayed) Labs Reviewed  COMPREHENSIVE METABOLIC PANEL - Abnormal; Notable for the following components:      Result Value   Glucose, Bld 113 (*)    BUN 43 (*)    GFR, Estimated 58 (*)    All other components within normal limits  CBC WITH DIFFERENTIAL/PLATELET - Abnormal; Notable for the following components:   RBC 4.13 (*)    Hemoglobin 12.8 (*)    Platelets 139 (*)    All other components within normal limits  URINALYSIS, ROUTINE W REFLEX MICROSCOPIC - Abnormal; Notable for the following components:   Color, Urine YELLOW (*)    APPearance HAZY (*)    Nitrite POSITIVE (*)    Leukocytes,Ua MODERATE (*)    Bacteria, UA MANY (*)    All other components within normal limits  URINE CULTURE  CULTURE, BLOOD (  ROUTINE X 2)  CULTURE, BLOOD (ROUTINE X 2)  LACTIC ACID, PLASMA     EKG EKG independently reviewed interpreted by myself (ER attending) demonstrates:    RADIOLOGY Imaging independently reviewed and interpreted by myself demonstrates:  CT abdomen pelvis with stone in the common bile duct  PROCEDURES:  Critical Care performed: No  Procedures   MEDICATIONS ORDERED IN ED: Medications  iohexol (OMNIPAQUE) 300 MG/ML solution 100 mL (100 mLs Intravenous Contrast Given 08/19/22 1750)  piperacillin-tazobactam (ZOSYN) IVPB 3.375 g (0 g Intravenous Stopped 08/19/22 2253)     IMPRESSION / MDM / ASSESSMENT AND PLAN / ED COURSE  I reviewed the triage vital signs and the nursing notes.  Differential diagnosis includes, but is not limited to, UTI, anemia, electrolyte abnormality, acute intra-abdominal process including bowel obstruction, pyelonephritis  Patient's presentation is most consistent with acute presentation with potential threat to life or bodily function.  89 year old  male presenting to the emergency department with known UTI.  I did review prior micro from February.  At that time, patient's urine culture did grow E. coli resistant to ciprofloxacin.  Suspect that presents with may be the cause of patient's lack of improvement.  However, with his abdominal distention, will obtain a CT to further evaluate.  I did discuss head imaging, but daughter does note that he is currently at baseline so we will hold off on this.  CT abdomen pelvis without evidence of pyelonephritis.  Did demonstrate signs of possible chronic bladder outlet obstruction, may be related to patient's recurrent UTIs.  However, it was also notable for a large stone at the distal common bile duct.  LFTs within normal limits and patient's clinical presentation is not suggestive of acute cholangitis.  I did review the case with Dr. Timothy Lasso with GI.  He agreed that patient's current presentation is not consistent with cholangitis, but patient at very high risk for development of this and he did recommend an ERCP.  We do not currently have the ability to perform an ERCP at our facility for the next several days.  With this, he did recommend transfer to outside facility.  He did initially recommend treatment of his UTI with Zosyn as this would also provide prophylaxis for any developing cholangitis.  I did speak with representatives from [facility] and [facility] who all reported that their facilities were at capacity.  [facility] was contacted. Patient was accepted in transfer by Dr. Laurey Morale.  Blood cultures were sent and patient was ordered for Zosyn.  Patient and family updated on plans for transfer and are agreeable with this plan.     FINAL CLINICAL IMPRESSION(S) / ED DIAGNOSES   Final diagnoses:  Choledocholithiasis  Acute cystitis without hematuria     Rx / DC Orders   ED Discharge Orders     None        Note:  This document was prepared using Dragon voice recognition  software and may include unintentional dictation errors.   Trinna Post, MD 08/19/22 [phone]

## 2022-08-19 NOTE — ED Notes (Signed)
Called to [facility] per MD Ray/faxed face sheet & power shared images/ rep:[email or web address] Kitchen

## 2022-08-20 LAB — CULTURE, BLOOD (ROUTINE X 2): Culture: NO GROWTH

## 2022-08-21 LAB — URINE CULTURE: Culture: >=100000 — AB

## 2022-08-21 LAB — CULTURE, BLOOD (ROUTINE X 2)

## 2022-08-22 NOTE — Consult Note (Signed)
ED Antimicrobial Stewardship Positive Culture Follow Up   Robert [name] is an 89 y.o. male who presented to [facility] on 08/19/2022 with a chief complaint of recurrent UTI. Patient was found to have a large stone at the distal common bile duct. Per GI, patient's presentation was not consistent with cholangitis, but patient was at very high risk for development of this. We did not have the ability to perform an ERCP at our facility for the next several days, so transfer to outside facility was recommended. Patient was accepted for transfer to [facility].  Per patient's daughter he was seen at [facility] and the bile duct stone was not visualized. She reports that new antibiotics per [facility] ED cultures were sent at discharge and that she picked them up yesterday, 6/8. She was unsure of the name of the antibiotic.  Chief Complaint  Patient presents with   Recurrent UTI    Recent Results (from the past 720 hour(s))  Urine Culture     Status: Abnormal   Collection Time: 08/19/22  2:31 PM   Specimen: Urine, Random  Result Value Ref Range Status   Specimen Description   Final    URINE, RANDOM Performed at [facility], [address], [place], [place] [address]    Special Requests   Final    NONE Performed at [facility], [address] [place], [place] [address]    Culture >=100,000 COLONIES/mL ESCHERICHIA COLI (A)  Final   Report Status 08/21/2022 FINAL  Final   Organism ID, Bacteria ESCHERICHIA COLI (A)  Final      Susceptibility   Escherichia coli - MIC*    AMPICILLIN <=2 SENSITIVE Sensitive     CEFAZOLIN <=4 SENSITIVE Sensitive     CEFEPIME <=0.12 SENSITIVE Sensitive     CEFTRIAXONE <=0.25 SENSITIVE Sensitive     CIPROFLOXACIN >=4 RESISTANT Resistant     GENTAMICIN <=1 SENSITIVE Sensitive     IMIPENEM <=0.25 SENSITIVE Sensitive     NITROFURANTOIN <=16 SENSITIVE Sensitive     TRIMETH/SULFA <=20 SENSITIVE Sensitive     AMPICILLIN/SULBACTAM <=2 SENSITIVE Sensitive      PIP/TAZO <=4 SENSITIVE Sensitive     * >=100,000 COLONIES/mL ESCHERICHIA COLI  Blood Culture (routine x 2)     Status: None (Preliminary result)   Collection Time: 08/19/22 10:24 PM   Specimen: BLOOD  Result Value Ref Range Status   Specimen Description BLOOD RIGHT ARM  Final   Special Requests   Final    BOTTLES DRAWN AEROBIC AND ANAEROBIC Blood Culture results may not be optimal due to an excessive volume of blood received in culture bottles   Culture   Final    NO GROWTH 3 DAYS Performed at [facility], [address], [place], [place] [address]    Report Status PENDING  Incomplete  Blood Culture (routine x 2)     Status: None (Preliminary result)   Collection Time: 08/19/22 10:25 PM   Specimen: BLOOD  Result Value Ref Range Status   Specimen Description BLOOD LEFT ARM  Final   Special Requests   Final    BOTTLES DRAWN AEROBIC AND ANAEROBIC Blood Culture results may not be optimal due to an excessive volume of blood received in culture bottles   Culture   Final    NO GROWTH 3 DAYS Performed at [facility], [address] [place], [place] [address]    Report Status PENDING  Incomplete    []  Treated with antimicrobial, organism  resistant to prescribed antimicrobial [x]  Patient discharged originally without antimicrobial agent and treatment is now indicated  New antibiotic prescription: none  ED Provider: Malcolm Metro, PharmD PGY1 Pharmacy Resident 08/22/2022 12:15 PM

## 2022-08-24 LAB — CULTURE, BLOOD (ROUTINE X 2): Culture: NO GROWTH
